# Patient Record
Sex: Female | Born: 1940 | Race: White | Hispanic: No | Marital: Married | Smoking: Never smoker
Health system: Southern US, Community
[De-identification: ages and names within clinical notes are randomized; demographics above are authoritative.]

## PROBLEM LIST (undated history)

## (undated) DIAGNOSIS — E079 Disorder of thyroid, unspecified: Secondary | ICD-10-CM

## (undated) DIAGNOSIS — M810 Age-related osteoporosis without current pathological fracture: Secondary | ICD-10-CM

## (undated) DIAGNOSIS — G309 Alzheimer's disease, unspecified: Secondary | ICD-10-CM

## (undated) DIAGNOSIS — F0280 Dementia in other diseases classified elsewhere without behavioral disturbance: Secondary | ICD-10-CM

## (undated) DIAGNOSIS — I10 Essential (primary) hypertension: Secondary | ICD-10-CM

## (undated) DIAGNOSIS — R2681 Unsteadiness on feet: Secondary | ICD-10-CM

## (undated) HISTORY — DX: Essential (primary) hypertension: I10

## (undated) HISTORY — DX: Alzheimer's disease, unspecified: G30.9

## (undated) HISTORY — DX: Age-related osteoporosis without current pathological fracture: M81.0

## (undated) HISTORY — PX: ABDOMINAL HYSTERECTOMY: SHX81

## (undated) HISTORY — DX: Dementia in other diseases classified elsewhere, unspecified severity, without behavioral disturbance, psychotic disturbance, mood disturbance, and anxiety: F02.80

## (undated) HISTORY — PX: FRACTURE SURGERY: SHX138

## (undated) HISTORY — PX: CHOLECYSTECTOMY: SHX55

## (undated) HISTORY — DX: Unsteadiness on feet: R26.81

## (undated) HISTORY — PX: COLONOSCOPY: SHX174

## (undated) HISTORY — DX: Disorder of thyroid, unspecified: E07.9

---

## 2005-11-30 ENCOUNTER — Encounter: Admission: RE | Admit: 2005-11-30 | Discharge: 2005-11-30 | Payer: Self-pay | Admitting: General Surgery

## 2015-04-26 DIAGNOSIS — R0601 Orthopnea: Secondary | ICD-10-CM | POA: Diagnosis not present

## 2015-04-26 DIAGNOSIS — Z789 Other specified health status: Secondary | ICD-10-CM | POA: Diagnosis not present

## 2015-04-26 DIAGNOSIS — I10 Essential (primary) hypertension: Secondary | ICD-10-CM | POA: Diagnosis not present

## 2015-04-26 DIAGNOSIS — R630 Anorexia: Secondary | ICD-10-CM | POA: Diagnosis not present

## 2015-04-26 DIAGNOSIS — Z418 Encounter for other procedures for purposes other than remedying health state: Secondary | ICD-10-CM | POA: Diagnosis not present

## 2015-04-26 DIAGNOSIS — R5383 Other fatigue: Secondary | ICD-10-CM | POA: Diagnosis not present

## 2015-04-26 DIAGNOSIS — Z6824 Body mass index (BMI) 24.0-24.9, adult: Secondary | ICD-10-CM | POA: Diagnosis not present

## 2015-05-13 DIAGNOSIS — R0601 Orthopnea: Secondary | ICD-10-CM | POA: Diagnosis not present

## 2015-05-13 DIAGNOSIS — R069 Unspecified abnormalities of breathing: Secondary | ICD-10-CM | POA: Diagnosis not present

## 2015-05-13 DIAGNOSIS — Z23 Encounter for immunization: Secondary | ICD-10-CM | POA: Diagnosis not present

## 2015-05-24 DIAGNOSIS — R630 Anorexia: Secondary | ICD-10-CM | POA: Diagnosis not present

## 2015-07-03 DIAGNOSIS — N183 Chronic kidney disease, stage 3 (moderate): Secondary | ICD-10-CM | POA: Diagnosis not present

## 2015-07-05 DIAGNOSIS — G309 Alzheimer's disease, unspecified: Secondary | ICD-10-CM | POA: Diagnosis not present

## 2015-07-05 DIAGNOSIS — M810 Age-related osteoporosis without current pathological fracture: Secondary | ICD-10-CM | POA: Diagnosis not present

## 2015-07-05 DIAGNOSIS — M25561 Pain in right knee: Secondary | ICD-10-CM | POA: Diagnosis not present

## 2015-07-05 DIAGNOSIS — R2681 Unsteadiness on feet: Secondary | ICD-10-CM | POA: Diagnosis not present

## 2015-08-06 DIAGNOSIS — E2839 Other primary ovarian failure: Secondary | ICD-10-CM | POA: Diagnosis not present

## 2015-08-16 ENCOUNTER — Encounter (HOSPITAL_COMMUNITY): Payer: Self-pay | Admitting: Emergency Medicine

## 2015-08-16 ENCOUNTER — Emergency Department (HOSPITAL_COMMUNITY): Payer: Medicare Other

## 2015-08-16 ENCOUNTER — Observation Stay (HOSPITAL_COMMUNITY)
Admission: EM | Admit: 2015-08-16 | Discharge: 2015-08-18 | Disposition: A | Discharge status: Home / Self Care | Payer: Medicare Other | Attending: Internal Medicine | Admitting: Internal Medicine

## 2015-08-16 DIAGNOSIS — I951 Orthostatic hypotension: Principal | ICD-10-CM

## 2015-08-16 DIAGNOSIS — Z79899 Other long term (current) drug therapy: Secondary | ICD-10-CM | POA: Insufficient documentation

## 2015-08-16 DIAGNOSIS — E039 Hypothyroidism, unspecified: Secondary | ICD-10-CM | POA: Diagnosis present

## 2015-08-16 DIAGNOSIS — R55 Syncope and collapse: Secondary | ICD-10-CM | POA: Diagnosis present

## 2015-08-16 DIAGNOSIS — R296 Repeated falls: Secondary | ICD-10-CM

## 2015-08-16 DIAGNOSIS — Z7982 Long term (current) use of aspirin: Secondary | ICD-10-CM | POA: Insufficient documentation

## 2015-08-16 DIAGNOSIS — R52 Pain, unspecified: Secondary | ICD-10-CM

## 2015-08-16 DIAGNOSIS — T1490 Injury, unspecified: Secondary | ICD-10-CM | POA: Diagnosis not present

## 2015-08-16 DIAGNOSIS — I10 Essential (primary) hypertension: Secondary | ICD-10-CM | POA: Diagnosis present

## 2015-08-16 DIAGNOSIS — T148 Other injury of unspecified body region: Secondary | ICD-10-CM | POA: Diagnosis not present

## 2015-08-16 DIAGNOSIS — M25552 Pain in left hip: Secondary | ICD-10-CM | POA: Diagnosis not present

## 2015-08-16 LAB — COMPREHENSIVE METABOLIC PANEL
ALK PHOS: 55 U/L (ref 38–126)
ALT: 18 U/L (ref 14–54)
ANION GAP: 7 (ref 5–15)
AST: 24 U/L (ref 15–41)
Albumin: 3.5 g/dL (ref 3.5–5.0)
BUN: 25 mg/dL — ABNORMAL HIGH (ref 6–20)
CO2: 24 mmol/L (ref 22–32)
Calcium: 8.2 mg/dL — ABNORMAL LOW (ref 8.9–10.3)
Chloride: 102 mmol/L (ref 101–111)
Creatinine, Ser: 1.37 mg/dL — ABNORMAL HIGH (ref 0.44–1.00)
GFR, EST AFRICAN AMERICAN: 43 mL/min — AB (ref >60)
GFR, EST NON AFRICAN AMERICAN: 37 mL/min — AB (ref >60)
Glucose, Bld: 83 mg/dL (ref 65–99)
POTASSIUM: 3.8 mmol/L (ref 3.5–5.1)
SODIUM: 133 mmol/L — AB (ref 135–145)
TOTAL PROTEIN: 6 g/dL — AB (ref 6.5–8.1)
Total Bilirubin: 1.1 mg/dL (ref 0.3–1.2)

## 2015-08-16 LAB — CBC WITH DIFFERENTIAL/PLATELET
BASOS PCT: 0 %
Basophils Absolute: 0 10*3/uL (ref 0.0–0.1)
EOS ABS: 0.1 10*3/uL (ref 0.0–0.7)
Eosinophils Relative: 1 %
HCT: 32.8 % — ABNORMAL LOW (ref 36.0–46.0)
Hemoglobin: 11 g/dL — ABNORMAL LOW (ref 12.0–15.0)
Lymphocytes Relative: 35 %
Lymphs Abs: 3.9 10*3/uL (ref 0.7–4.0)
MCH: 32.9 pg (ref 26.0–34.0)
MCHC: 33.5 g/dL (ref 30.0–36.0)
MCV: 98.2 fL (ref 78.0–100.0)
MONO ABS: 1 10*3/uL (ref 0.1–1.0)
MONOS PCT: 9 %
Neutro Abs: 6 10*3/uL (ref 1.7–7.7)
Neutrophils Relative %: 55 %
Platelets: 320 10*3/uL (ref 150–400)
RBC: 3.34 MIL/uL — ABNORMAL LOW (ref 3.87–5.11)
RDW: 15.1 % (ref 11.5–15.5)
WBC: 11 10*3/uL — ABNORMAL HIGH (ref 4.0–10.5)

## 2015-08-16 LAB — URINALYSIS, ROUTINE W REFLEX MICROSCOPIC
Bilirubin Urine: NEGATIVE
GLUCOSE, UA: NEGATIVE mg/dL
Hgb urine dipstick: NEGATIVE
Ketones, ur: NEGATIVE mg/dL
LEUKOCYTES UA: NEGATIVE
Nitrite: NEGATIVE
PROTEIN: NEGATIVE mg/dL
SPECIFIC GRAVITY, URINE: 1.01 (ref 1.005–1.030)
pH: 5.5 (ref 5.0–8.0)

## 2015-08-16 LAB — RAPID URINE DRUG SCREEN, HOSP PERFORMED
Amphetamines: NOT DETECTED
BARBITURATES: NOT DETECTED
Benzodiazepines: NOT DETECTED
COCAINE: NOT DETECTED
OPIATES: POSITIVE — AB
Tetrahydrocannabinol: NOT DETECTED

## 2015-08-16 LAB — ACETAMINOPHEN LEVEL

## 2015-08-16 LAB — TROPONIN I

## 2015-08-16 LAB — ETHANOL

## 2015-08-16 LAB — SALICYLATE LEVEL

## 2015-08-16 LAB — LACTIC ACID, PLASMA: Lactic Acid, Venous: 0.8 mmol/L (ref 0.5–2.0)

## 2015-08-16 MED ORDER — SODIUM CHLORIDE 0.9 % IV BOLUS (SEPSIS)
500.0000 mL | Freq: Once | INTRAVENOUS | Status: AC
  Administered 2015-08-16: 500 mL via INTRAVENOUS

## 2015-08-16 MED ORDER — SODIUM CHLORIDE 0.9 % IV SOLN
INTRAVENOUS | Status: DC
  Administered 2015-08-16: 23:00:00 via INTRAVENOUS

## 2015-08-16 NOTE — ED Provider Notes (Signed)
Labs shows what is likely mild AKI (no baseline). She is orthostatic with BP dropping to 90s and her feeling symptomatic. Given fluids. Given frequent falls and multiple bruises, will admit to obs. Dr. Hal Hope requests tele bed.  Sherwood Gambler, MD 08/16/15 [phone]

## 2015-08-16 NOTE — ED Notes (Signed)
Pt with fall x 2 today. No c/o pain. Pt sent to ED by family for eval. No LOC. Pt has had mult recent falls, reports she is not eating well right now but that is normal for her. PCP has pt on an appetite medication.

## 2015-08-16 NOTE — ED Notes (Signed)
Pt in radiology 

## 2015-08-16 NOTE — ED Provider Notes (Signed)
CSN: [id number]     Arrival date & time 08/16/15  1944 History   First MD Initiated Contact with Patient 08/16/15 1951     Chief Complaint  Patient presents with  . Fall      HPI Pt was seen at 2010. Per pt and her family, c/o gradual onset and persistence of multiple intermittent episodes of "falling" for the past 1 year, worse over the past few days. Pt told family her "left hip hurt" but she denies this now. Denies any specific complaint. Denies syncope/near syncope, no AMS, no back pain, no CP/palpitations, no SOB/cough, no abd pain, no N/V/D, no fevers, no focal motor weakness, no tingling/numbness in extremities, no facial droop, no slurred speech.    History reviewed. No pertinent past medical history.   Past Surgical History  Procedure Laterality Date  . Cholecystectomy    . Fracture surgery    . Abdominal hysterectomy     Family History  Problem Relation Age of Onset  . Cancer Mother   . Cancer Father   . Heart attack Sister    Social History  Substance Use Topics  . Smoking status: Never Smoker   . Smokeless tobacco: Never Used  . Alcohol Use: No   OB History    Gravida Para Term Preterm AB TAB SAB Ectopic Multiple Living   1 1 1             Review of Systems ROS: Statement: All systems negative except as marked or noted in the HPI; Constitutional: Negative for fever and chills. +frequent falls.; ; Eyes: Negative for eye pain, redness and discharge. ; ; ENMT: Negative for ear pain, hoarseness, nasal congestion, sinus pressure and sore throat. ; ; Cardiovascular: Negative for chest pain, palpitations, diaphoresis, dyspnea and peripheral edema. ; ; Respiratory: Negative for cough, wheezing and stridor. ; ; Gastrointestinal: Negative for nausea, vomiting, diarrhea, abdominal pain, blood in stool, hematemesis, jaundice and rectal bleeding. . ; ; Genitourinary: Negative for dysuria, flank pain and hematuria. ; ; Musculoskeletal: +left hip pain. Negative for back pain and  neck pain. Negative for swelling and deformity.; ; Skin: Negative for pruritus, rash, abrasions, blisters, bruising and skin lesion.; ; Neuro: Negative for headache, lightheadedness and neck stiffness. Negative for weakness, altered level of consciousness, altered mental status, extremity weakness, paresthesias, involuntary movement, seizure and syncope.      Allergies  Review of patient's allergies indicates no known allergies.  Home Medications   Prior to Admission medications   Not on File   BP 140/77 mmHg  Pulse 64  Temp(Src) 98.3 F (36.8 C) (Oral)  Resp 20  Ht 5\' 3"  (1.6 m)  Wt 125 lb (56.7 kg)  BMI 22.15 kg/m2  SpO2 100%   21:16 Orthostatic Vital Signs JW  Orthostatic Lying  - BP- Lying: 130/68 mmHg ; Pulse- Lying: 64  Orthostatic Sitting - BP- Sitting: 123/83 mmHg ; Pulse- Sitting: 67  Orthostatic Standing at 0 minutes - BP- Standing at 0 minutes: 93/35 mmHg (Patient feld weak while she was standing) ; Pulse- Standing at 0 minutes: 72      Physical Exam  2015: Physical examination: Vital signs and O2 SAT: Reviewed; Constitutional: Well developed, Well nourished, Well hydrated, In no acute distress; Head and Face: Normocephalic, Atraumatic; Eyes: EOMI, PERRL, No scleral icterus; ENMT: Mouth and pharynx normal, Mucous membranes moist; Neck: Supple, Trachea midline; Spine: No midline CS, TS, LS tenderness.; Cardiovascular: Regular rate and rhythm, No gallop; Respiratory: Breath sounds clear & equal bilaterally,  No wheezes, Normal respiratory effort/excursion; Chest: Nontender, No deformity, Movement normal, No crepitus, No abrasions or ecchymosis.; Abdomen: Soft, Nontender, Nondistended, Normal bowel sounds, No abrasions or ecchymosis.; Genitourinary: No CVA tenderness;; Extremities: No deformity, +mild left hip TTP, otherwise full range of motion major/large joints of bilat UE's and LE's without pain or tenderness to palp, Neurovascularly intact, Pulses normal, No tenderness, No  edema, Pelvis stable. +scattered ecchymosis in various stages of healing bilat LE's.; Neuro: AA&Ox3. Vague historian.  Major CN grossly intact.  No facial droop. Speech clear. No gross focal motor or sensory deficits in extremities.; Skin: Color normal, Warm, Dry.   ED Course  Procedures (including critical care time) Labs Review  Imaging Review  I have personally reviewed and evaluated these images and lab results as part of my medical decision-making.   EKG Interpretation None      MDM  MDM Reviewed: nursing note and vitals Interpretation: labs, ECG, x-ray and CT scan   ED ECG REPORT   Date: 08/16/2015  Rate: 61  Rhythm: normal sinus rhythm  QRS Axis: normal  Intervals: normal  ST/T Wave abnormalities: normal  Conduction Disutrbances:none  Narrative Interpretation:   Old EKG Reviewed: none available.  Results for orders placed or performed during the hospital encounter of 08/16/15  Comprehensive metabolic panel  Result Value Ref Range   Sodium 133 (L) 135 - 145 mmol/L   Potassium 3.8 3.5 - 5.1 mmol/L   Chloride 102 101 - 111 mmol/L   CO2 24 22 - 32 mmol/L   Glucose, Bld 83 65 - 99 mg/dL   BUN 25 (H) 6 - 20 mg/dL   Creatinine, Ser 1.37 (H) 0.44 - 1.00 mg/dL   Calcium 8.2 (L) 8.9 - 10.3 mg/dL   Total Protein 6.0 (L) 6.5 - 8.1 g/dL   Albumin 3.5 3.5 - 5.0 g/dL   AST 24 15 - 41 U/L   ALT 18 14 - 54 U/L   Alkaline Phosphatase 55 38 - 126 U/L   Total Bilirubin 1.1 0.3 - 1.2 mg/dL   GFR calc non Af Amer 37 (L) >60 mL/min   GFR calc Af Amer 43 (L) >60 mL/min   Anion gap 7 5 - 15  Ethanol  Result Value Ref Range   Alcohol, Ethyl (B) <5 <5 mg/dL  Acetaminophen level  Result Value Ref Range   Acetaminophen (Tylenol), Serum <10 (L) 10 - 30 ug/mL  Salicylate level  Result Value Ref Range   Salicylate Lvl 123456 2.8 - 30.0 mg/dL  Troponin I  Result Value Ref Range   Troponin I <0.03 <0.031 ng/mL  Lactic acid, plasma  Result Value Ref Range   Lactic Acid, Venous  0.8 0.5 - 2.0 mmol/L  CBC with Differential  Result Value Ref Range   WBC 11.0 (H) 4.0 - 10.5 K/uL   RBC 3.34 (L) 3.87 - 5.11 MIL/uL   Hemoglobin 11.0 (L) 12.0 - 15.0 g/dL   HCT 32.8 (L) 36.0 - 46.0 %   MCV 98.2 78.0 - 100.0 fL   MCH 32.9 26.0 - 34.0 pg   MCHC 33.5 30.0 - 36.0 g/dL   RDW 15.1 11.5 - 15.5 %   Platelets 320 150 - 400 K/uL   Neutrophils Relative % 55 %   Neutro Abs 6.0 1.7 - 7.7 K/uL   Lymphocytes Relative 35 %   Lymphs Abs 3.9 0.7 - 4.0 K/uL   Monocytes Relative 9 %   Monocytes Absolute 1.0 0.1 - 1.0 K/uL   Eosinophils Relative  1 %   Eosinophils Absolute 0.1 0.0 - 0.7 K/uL   Basophils Relative 0 %   Basophils Absolute 0.0 0.0 - 0.1 K/uL   Ct Head Wo Contrast 08/16/2015  CLINICAL DATA:  Leg weakness, multiple falls, including 2 falls today. No loss of consciousness. EXAM: CT HEAD WITHOUT CONTRAST CT CERVICAL SPINE WITHOUT CONTRAST TECHNIQUE: Multidetector CT imaging of the head and cervical spine was performed following the standard protocol without intravenous contrast. Multiplanar CT image reconstructions of the cervical spine were also generated. COMPARISON:  None. FINDINGS: CT HEAD FINDINGS INTRACRANIAL CONTENTS: The ventricles and sulci are upper limits of normal for age. No intraparenchymal hemorrhage, mass effect nor midline shift. Patchy supratentorial white matter hypodensities are within normal range for patient's age and though non-specific likely represent chronic small vessel ischemic disease. No acute large vascular territory infarcts. No abnormal extra-axial fluid collections. Basal cisterns are patent. Mild calcific atherosclerosis of the carotid siphons. ORBITS: The included ocular globes and orbital contents are non-suspicious. SINUSES: The mastoid aircells and included paranasal sinuses are well-aerated. SKULL/SOFT TISSUES: No skull fracture. No significant soft tissue swelling. CT CERVICAL SPINE FINDINGS OSSEOUS STRUCTURES: Cervical vertebral bodies intact.  Straightened cervical lordosis. Grade 1 C4-5 anterolisthesis with C5-6 auto interbody arthrodesis infused assessed on degenerative basis. Moderate C5-6 and C6-7 disc height loss. No destructive bony lesions. C1-2 articulation maintained with moderate arthropathy. Severe LEFT C4-5 facet arthropathy. No osseous canal stenosis. Severe LEFT C4-5 neural foraminal narrowing. SOFT TISSUES: Included prevertebral and paraspinal soft tissues are nonacute, mild calcific atherosclerosis of the carotid bulbs. IMPRESSION: CT HEAD: No acute intracranial process. Mild prominence of ventricles and sulci for age. Moderate chronic small vessel ischemic disease. CT CERVICAL SPINE: Straightened cervical lordosis without acute fracture. Grade 1 C4-5 anterolisthesis on degenerative basis. Electronically Signed   By: Elon Alas M.D.   On: 08/16/2015 22:23   Ct Cervical Spine Wo Contrast 08/16/2015  CLINICAL DATA:  Leg weakness, multiple falls, including 2 falls today. No loss of consciousness. EXAM: CT HEAD WITHOUT CONTRAST CT CERVICAL SPINE WITHOUT CONTRAST TECHNIQUE: Multidetector CT imaging of the head and cervical spine was performed following the standard protocol without intravenous contrast. Multiplanar CT image reconstructions of the cervical spine were also generated. COMPARISON:  None. FINDINGS: CT HEAD FINDINGS INTRACRANIAL CONTENTS: The ventricles and sulci are upper limits of normal for age. No intraparenchymal hemorrhage, mass effect nor midline shift. Patchy supratentorial white matter hypodensities are within normal range for patient's age and though non-specific likely represent chronic small vessel ischemic disease. No acute large vascular territory infarcts. No abnormal extra-axial fluid collections. Basal cisterns are patent. Mild calcific atherosclerosis of the carotid siphons. ORBITS: The included ocular globes and orbital contents are non-suspicious. SINUSES: The mastoid aircells and included paranasal  sinuses are well-aerated. SKULL/SOFT TISSUES: No skull fracture. No significant soft tissue swelling. CT CERVICAL SPINE FINDINGS OSSEOUS STRUCTURES: Cervical vertebral bodies intact. Straightened cervical lordosis. Grade 1 C4-5 anterolisthesis with C5-6 auto interbody arthrodesis infused assessed on degenerative basis. Moderate C5-6 and C6-7 disc height loss. No destructive bony lesions. C1-2 articulation maintained with moderate arthropathy. Severe LEFT C4-5 facet arthropathy. No osseous canal stenosis. Severe LEFT C4-5 neural foraminal narrowing. SOFT TISSUES: Included prevertebral and paraspinal soft tissues are nonacute, mild calcific atherosclerosis of the carotid bulbs. IMPRESSION: CT HEAD: No acute intracranial process. Mild prominence of ventricles and sulci for age. Moderate chronic small vessel ischemic disease. CT CERVICAL SPINE: Straightened cervical lordosis without acute fracture. Grade 1 C4-5 anterolisthesis on degenerative basis. Electronically  Signed   By: Elon Alas M.D.   On: 08/16/2015 22:23    Dg Chest 2 View 08/16/2015  CLINICAL DATA:  75 year old female with history of multiple falls today. EXAM: CHEST  2 VIEW COMPARISON:  Chest x-ray 02/15/2015. FINDINGS: Lung volumes are normal. No consolidative airspace disease. No pleural effusions. No pneumothorax. No pulmonary nodule or mass noted. Pulmonary vasculature and the cardiomediastinal silhouette are within normal limits. Old healed fracture of the anterior aspect of the right fourth rib again noted. IMPRESSION: No radiographic evidence of acute cardiopulmonary disease. Electronically Signed   By: Vinnie Langton M.D.   On: 08/16/2015 21:11   Dg Hip Unilat With Pelvis 2-3 Views Left 08/16/2015  CLINICAL DATA:  75 year old female with history of trauma from multiple falls today. Left hip pain. EXAM: DG HIP (WITH OR WITHOUT PELVIS) 2-3V LEFT COMPARISON:  No priors. FINDINGS: AP view of the pelvis and AP and lateral views of the left  hip demonstrate no acute displaced fractures of the bony pelvic ring. Bilateral proximal femurs as visualized appear intact, and the left femoral head is properly located. There is mild joint space narrowing, subchondral sclerosis, subchondral cyst formation and osteophyte formation in the hip joints bilaterally, compatible with mild osteoarthritis. IMPRESSION: 1. No acute radiographic abnormality of the bony pelvis or left hip. 2. Mild bilateral hip joint osteoarthritis. Electronically Signed   By: Vinnie Langton M.D.   On: 08/16/2015 21:12    2230:  +orthostatic on VS; judicious IVF given. Pt unable to walk d/t orthostasis on standing. BUN/Cr elevated, no old to compare. Udip pending. Will need admit if continues unable to walk. Sign out to Dr. Regenia Skeeter.    Francine Graven, DO 08/17/15 Valerie Roys

## 2015-08-17 ENCOUNTER — Encounter (HOSPITAL_COMMUNITY): Payer: Self-pay | Admitting: Internal Medicine

## 2015-08-17 ENCOUNTER — Observation Stay (HOSPITAL_COMMUNITY): Payer: Medicare Other

## 2015-08-17 ENCOUNTER — Observation Stay (HOSPITAL_BASED_OUTPATIENT_CLINIC_OR_DEPARTMENT_OTHER): Payer: Medicare Other

## 2015-08-17 DIAGNOSIS — R55 Syncope and collapse: Secondary | ICD-10-CM

## 2015-08-17 DIAGNOSIS — I951 Orthostatic hypotension: Secondary | ICD-10-CM | POA: Diagnosis not present

## 2015-08-17 DIAGNOSIS — E039 Hypothyroidism, unspecified: Secondary | ICD-10-CM | POA: Diagnosis present

## 2015-08-17 DIAGNOSIS — M25561 Pain in right knee: Secondary | ICD-10-CM | POA: Diagnosis not present

## 2015-08-17 DIAGNOSIS — M79652 Pain in left thigh: Secondary | ICD-10-CM | POA: Diagnosis not present

## 2015-08-17 DIAGNOSIS — I10 Essential (primary) hypertension: Secondary | ICD-10-CM | POA: Diagnosis not present

## 2015-08-17 DIAGNOSIS — M25562 Pain in left knee: Secondary | ICD-10-CM | POA: Diagnosis not present

## 2015-08-17 LAB — CBC WITH DIFFERENTIAL/PLATELET
BASOS ABS: 0 10*3/uL (ref 0.0–0.1)
BASOS PCT: 0 %
EOS ABS: 0.1 10*3/uL (ref 0.0–0.7)
EOS PCT: 1 %
HCT: 29.5 % — ABNORMAL LOW (ref 36.0–46.0)
Hemoglobin: 10 g/dL — ABNORMAL LOW (ref 12.0–15.0)
Lymphocytes Relative: 38 %
Lymphs Abs: 3.2 10*3/uL (ref 0.7–4.0)
MCH: 33.2 pg (ref 26.0–34.0)
MCHC: 33.9 g/dL (ref 30.0–36.0)
MCV: 98 fL (ref 78.0–100.0)
Monocytes Absolute: 0.8 10*3/uL (ref 0.1–1.0)
Monocytes Relative: 10 %
Neutro Abs: 4.3 10*3/uL (ref 1.7–7.7)
Neutrophils Relative %: 51 %
PLATELETS: 255 10*3/uL (ref 150–400)
RBC: 3.01 MIL/uL — AB (ref 3.87–5.11)
RDW: 14.9 % (ref 11.5–15.5)
WBC: 8.3 10*3/uL (ref 4.0–10.5)

## 2015-08-17 LAB — ECHOCARDIOGRAM COMPLETE
Height: 63 in
Weight: 2563.2 oz

## 2015-08-17 LAB — COMPREHENSIVE METABOLIC PANEL
ALBUMIN: 2.9 g/dL — AB (ref 3.5–5.0)
ALT: 15 U/L (ref 14–54)
AST: 21 U/L (ref 15–41)
Alkaline Phosphatase: 44 U/L (ref 38–126)
Anion gap: 4 — ABNORMAL LOW (ref 5–15)
BUN: 22 mg/dL — AB (ref 6–20)
CHLORIDE: 111 mmol/L (ref 101–111)
CO2: 20 mmol/L — AB (ref 22–32)
CREATININE: 1.23 mg/dL — AB (ref 0.44–1.00)
Calcium: 7.7 mg/dL — ABNORMAL LOW (ref 8.9–10.3)
GFR calc non Af Amer: 42 mL/min — ABNORMAL LOW (ref >60)
GFR, EST AFRICAN AMERICAN: 48 mL/min — AB (ref >60)
Glucose, Bld: 94 mg/dL (ref 65–99)
Potassium: 3.6 mmol/L (ref 3.5–5.1)
SODIUM: 135 mmol/L (ref 135–145)
Total Bilirubin: 1.2 mg/dL (ref 0.3–1.2)
Total Protein: 5.1 g/dL — ABNORMAL LOW (ref 6.5–8.1)

## 2015-08-17 LAB — LACTIC ACID, PLASMA: LACTIC ACID, VENOUS: 0.6 mmol/L (ref 0.5–2.0)

## 2015-08-17 LAB — CK: CK TOTAL: 253 U/L — AB (ref 38–234)

## 2015-08-17 LAB — TROPONIN I: Troponin I: <0.03 ng/mL (ref <0.031)

## 2015-08-17 MED ORDER — ACETAMINOPHEN 325 MG PO TABS: 650.0000 mg | ORAL_TABLET | Freq: Four times a day (QID) | ORAL | Status: DC | PRN

## 2015-08-17 MED ORDER — OXYCODONE-ACETAMINOPHEN 5-325 MG PO TABS: 1.0000 | ORAL_TABLET | Freq: Three times a day (TID) | ORAL | Status: DC | PRN

## 2015-08-17 MED ORDER — ONDANSETRON HCL 4 MG/2ML IJ SOLN: 4.0000 mg | Freq: Four times a day (QID) | INTRAMUSCULAR | Status: DC | PRN

## 2015-08-17 MED ORDER — SODIUM CHLORIDE 0.9 % IV SOLN
INTRAVENOUS | Status: DC
  Administered 2015-08-17: 04:00:00 via INTRAVENOUS

## 2015-08-17 MED ORDER — MEGESTROL ACETATE 40 MG/ML PO SUSP
600.0000 mg | Freq: Three times a day (TID) | ORAL | Status: DC
  Administered 2015-08-17 (×3): 600 mg via ORAL
  Filled 2015-08-17 (×7): qty 15

## 2015-08-17 MED ORDER — ZOLPIDEM TARTRATE 5 MG PO TABS: 5.0000 mg | ORAL_TABLET | Freq: Every evening | ORAL | Status: DC | PRN

## 2015-08-17 MED ORDER — ONDANSETRON HCL 4 MG PO TABS: 4.0000 mg | ORAL_TABLET | Freq: Four times a day (QID) | ORAL | Status: DC | PRN

## 2015-08-17 MED ORDER — ATENOLOL 25 MG PO TABS
50.0000 mg | ORAL_TABLET | Freq: Every day | ORAL | Status: DC
  Administered 2015-08-17 – 2015-08-18 (×2): 50 mg via ORAL
  Filled 2015-08-17 (×2): qty 2

## 2015-08-17 MED ORDER — ACETAMINOPHEN 650 MG RE SUPP: 650.0000 mg | Freq: Four times a day (QID) | RECTAL | Status: DC | PRN

## 2015-08-17 MED ORDER — ENSURE ENLIVE PO LIQD
237.0000 mL | Freq: Two times a day (BID) | ORAL | Status: DC
  Administered 2015-08-17 – 2015-08-18 (×3): 237 mL via ORAL

## 2015-08-17 MED ORDER — CITALOPRAM HYDROBROMIDE 20 MG PO TABS
10.0000 mg | ORAL_TABLET | Freq: Every day | ORAL | Status: DC
  Administered 2015-08-17: 10 mg via ORAL
  Filled 2015-08-17: qty 1

## 2015-08-17 MED ORDER — CLONAZEPAM 0.5 MG PO TABS
0.5000 mg | ORAL_TABLET | Freq: Two times a day (BID) | ORAL | Status: DC
  Administered 2015-08-17 – 2015-08-18 (×3): 0.5 mg via ORAL
  Filled 2015-08-17 (×3): qty 1

## 2015-08-17 MED ORDER — HEPARIN SODIUM (PORCINE) 5000 UNIT/ML IJ SOLN
5000.0000 [IU] | Freq: Three times a day (TID) | INTRAMUSCULAR | Status: DC
  Administered 2015-08-17 – 2015-08-18 (×4): 5000 [IU] via SUBCUTANEOUS
  Filled 2015-08-17 (×4): qty 1

## 2015-08-17 MED ORDER — DONEPEZIL HCL 5 MG PO TABS
5.0000 mg | ORAL_TABLET | Freq: Every morning | ORAL | Status: DC
  Administered 2015-08-17 – 2015-08-18 (×2): 5 mg via ORAL
  Filled 2015-08-17 (×2): qty 1

## 2015-08-17 MED ORDER — LEVOTHYROXINE SODIUM 50 MCG PO TABS
50.0000 ug | ORAL_TABLET | Freq: Every day | ORAL | Status: DC
  Administered 2015-08-17 – 2015-08-18 (×2): 50 ug via ORAL
  Filled 2015-08-17 (×2): qty 1

## 2015-08-17 MED ORDER — ATENOLOL 25 MG PO TABS: 50.0000 mg | ORAL_TABLET | Freq: Every day | ORAL | Status: DC

## 2015-08-17 NOTE — Progress Notes (Addendum)
PROGRESS NOTE        PATIENT DETAILS Name: Monica Herman Age: 75 y.o. Sex: female Date of Birth: Aug 04, 1940 Admit Date: 08/16/2015 Admitting Physician Rise Patience, MD [id number], Levada Dy, NP  Brief Narrative: Patient is a 75 y.o. female with PMHx of hypertension, hypothyroidism, mild dementia presented for evaluation of frequent falls. Found to be orthostatic, admitted for further evaluation and treatment.  Subjective: No complaints this morning-denies any chest pain or shortness of breath. Large left ecchymotic area and upper thigh appears superficial and stable.  Assessment/Plan: Principal Problem: Frequent falls with near syncope: Likely secondary to orthostatic hypotension, telemetry negative. CT head/CT spine, pelvic x-ray negative for fractures. Await echocardiogram. Ambulate with physical therapy and follow recommendations.  Active Problems: Orthostatic hypotension: Likely secondary to HCTZ which has now been discontinued. Hydrated overnight, orthostatic vital signs have resolved this morning. Place TED hose, cautiously continue with beta blockers and ambulate with physical therapy.   Ecchymosis left upper thigh: Appears mostly superficial-likely secondary to multiple falls. Hemoglobin stable.  History of present illness: Appears mild, likely secondary to HCTZ, improving.  History of hypertension: Avoid tight control given above. Cautiously continue with atenolol-would not rechallenge with HCTZ in the future.  Hypothyroidism: Continue levothyroxine  Dementia: Appears mild, continue Aricept.  Anxiety/depression: Appears stable with Klonopin and Celexa.  DVT Prophylaxis: Prophylactic Lovenox   Code Status: DNR-reconfirmed with patient  Family Communication: None at bedside  Disposition Plan: Remain inpatient-await PT eval-suspect either Home health vs SNF on discharge  Antimicrobial  agents: None  Procedures: Echo-pending  CONSULTS:  None  Time spent: 25 minutes-Greater than 50% of this time was spent in counseling, explanation of diagnosis, planning of further management, and coordination of care.  MEDICATIONS: Anti-infectives    None      Scheduled Meds: . atenolol  50 mg Oral Daily  . citalopram  10 mg Oral QHS  . clonazePAM  0.5 mg Oral BID  . donepezil  5 mg Oral q morning - 10a  . feeding supplement (ENSURE ENLIVE)  237 mL Oral BID BM  . heparin  5,000 Units Subcutaneous Q8H  . levothyroxine  50 mcg Oral QAC breakfast  . megestrol  600 mg Oral TID   Continuous Infusions: . sodium chloride 75 mL/hr at 08/17/15 0422   PRN Meds:.acetaminophen **OR** acetaminophen, ondansetron **OR** ondansetron (ZOFRAN) IV, oxyCODONE-acetaminophen, zolpidem   PHYSICAL EXAM: Vital signs: Filed Vitals:   08/16/15 2300 08/16/15 2330 08/17/15 0051 08/17/15 0555  BP: 119/63 137/66 144/65 131/73  Pulse: 62 66 66 70  Temp:   98.1 F (36.7 C) 98.7 F (37.1 C)  TempSrc:   Oral Oral  Resp: 18 18 18 20   Height:   5\' 3"  (1.6 m)   Weight:   72.666 kg (160 lb 3.2 oz)   SpO2: 100% 95% 100% 99%   Filed Weights   08/16/15 1944 08/17/15 0051  Weight: 56.7 kg (125 lb) 72.666 kg (160 lb 3.2 oz)   Body mass index is 28.39 kg/(m^2).   Gen Exam: Awake and alert with clear speech. Not in any distress Neck: Supple, No JVD.   Chest: B/L Clear.   CVS: S1 S2 Regular, no murmurs. Abdomen: soft, BS +, non tender, non distended.  Extremities: no edema, lower extremities warm to touch.Large ecchymotic area in the left upper thigh Neurologic: Non Focal.   Skin: No  Rash or lesions   Wounds: N/A.    LABORATORY DATA: CBC:  Recent Labs Lab 08/16/15 2120 08/17/15 0233  WBC 11.0* 8.3  NEUTROABS 6.0 4.3  HGB 11.0* 10.0*  HCT 32.8* 29.5*  MCV 98.2 98.0  PLT 320 [id number]    Basic Metabolic Panel:  Recent Labs Lab 08/16/15 2120 08/17/15 0233  NA 133* 135  K 3.8 3.6  CL  102 111  CO2 24 20*  GLUCOSE 83 94  BUN 25* 22*  CREATININE 1.37* 1.23*  CALCIUM 8.2* 7.7*    GFR: Estimated Creatinine Clearance: 37.7 mL/min (by C-G formula based on Cr of 1.23).  Liver Function Tests:  Recent Labs Lab 08/16/15 2120 08/17/15 0233  AST 24 21  ALT 18 15  ALKPHOS 55 44  BILITOT 1.1 1.2  PROT 6.0* 5.1*  ALBUMIN 3.5 2.9*   No results for input(s): LIPASE, AMYLASE in the last 168 hours. No results for input(s): AMMONIA in the last 168 hours.  Coagulation Profile: No results for input(s): INR, PROTIME in the last 168 hours.  Cardiac Enzymes:  Recent Labs Lab 08/16/15 2120 08/17/15 0233  CKTOTAL  --  253*  TROPONINI <0.03 <0.03    BNP (last 3 results) No results for input(s): PROBNP in the last 8760 hours.  HbA1C: No results for input(s): HGBA1C in the last 72 hours.  CBG: No results for input(s): GLUCAP in the last 168 hours.  Lipid Profile: No results for input(s): CHOL, HDL, LDLCALC, TRIG, CHOLHDL, LDLDIRECT in the last 72 hours.  Thyroid Function Tests: No results for input(s): TSH, T4TOTAL, FREET4, T3FREE, THYROIDAB in the last 72 hours.  Anemia Panel: No results for input(s): VITAMINB12, FOLATE, FERRITIN, TIBC, IRON, RETICCTPCT in the last 72 hours.  Urine analysis:    Component Value Date/Time   COLORURINE YELLOW 08/16/2015 2202   APPEARANCEUR CLEAR 08/16/2015 2202   LABSPEC 1.010 08/16/2015 2202   PHURINE 5.5 08/16/2015 2202   GLUCOSEU NEGATIVE 08/16/2015 2202   HGBUR NEGATIVE 08/16/2015 2202   BILIRUBINUR NEGATIVE 08/16/2015 2202   KETONESUR NEGATIVE 08/16/2015 2202   PROTEINUR NEGATIVE 08/16/2015 2202   NITRITE NEGATIVE 08/16/2015 2202   LEUKOCYTESUR NEGATIVE 08/16/2015 2202    Sepsis Labs: Lactic Acid, Venous    Component Value Date/Time   LATICACIDVEN 0.6 08/16/2015 2324    MICROBIOLOGY: No results found for this or any previous visit (from the past 240 hour(s)).  RADIOLOGY STUDIES/RESULTS: Dg Chest 2  View  08/16/2015  CLINICAL DATA:  75 year old female with history of multiple falls today. EXAM: CHEST  2 VIEW COMPARISON:  Chest x-ray 02/15/2015. FINDINGS: Lung volumes are normal. No consolidative airspace disease. No pleural effusions. No pneumothorax. No pulmonary nodule or mass noted. Pulmonary vasculature and the cardiomediastinal silhouette are within normal limits. Old healed fracture of the anterior aspect of the right fourth rib again noted. IMPRESSION: No radiographic evidence of acute cardiopulmonary disease. Electronically Signed   By: Vinnie Langton M.D.   On: 08/16/2015 21:11   Ct Head Wo Contrast  08/16/2015  CLINICAL DATA:  Leg weakness, multiple falls, including 2 falls today. No loss of consciousness. EXAM: CT HEAD WITHOUT CONTRAST CT CERVICAL SPINE WITHOUT CONTRAST TECHNIQUE: Multidetector CT imaging of the head and cervical spine was performed following the standard protocol without intravenous contrast. Multiplanar CT image reconstructions of the cervical spine were also generated. COMPARISON:  None. FINDINGS: CT HEAD FINDINGS INTRACRANIAL CONTENTS: The ventricles and sulci are upper limits of normal for age. No intraparenchymal hemorrhage, mass effect nor midline  shift. Patchy supratentorial white matter hypodensities are within normal range for patient's age and though non-specific likely represent chronic small vessel ischemic disease. No acute large vascular territory infarcts. No abnormal extra-axial fluid collections. Basal cisterns are patent. Mild calcific atherosclerosis of the carotid siphons. ORBITS: The included ocular globes and orbital contents are non-suspicious. SINUSES: The mastoid aircells and included paranasal sinuses are well-aerated. SKULL/SOFT TISSUES: No skull fracture. No significant soft tissue swelling. CT CERVICAL SPINE FINDINGS OSSEOUS STRUCTURES: Cervical vertebral bodies intact. Straightened cervical lordosis. Grade 1 C4-5 anterolisthesis with C5-6 auto  interbody arthrodesis infused assessed on degenerative basis. Moderate C5-6 and C6-7 disc height loss. No destructive bony lesions. C1-2 articulation maintained with moderate arthropathy. Severe LEFT C4-5 facet arthropathy. No osseous canal stenosis. Severe LEFT C4-5 neural foraminal narrowing. SOFT TISSUES: Included prevertebral and paraspinal soft tissues are nonacute, mild calcific atherosclerosis of the carotid bulbs. IMPRESSION: CT HEAD: No acute intracranial process. Mild prominence of ventricles and sulci for age. Moderate chronic small vessel ischemic disease. CT CERVICAL SPINE: Straightened cervical lordosis without acute fracture. Grade 1 C4-5 anterolisthesis on degenerative basis. Electronically Signed   By: Elon Alas M.D.   On: 08/16/2015 22:23   Ct Cervical Spine Wo Contrast  08/16/2015  CLINICAL DATA:  Leg weakness, multiple falls, including 2 falls today. No loss of consciousness. EXAM: CT HEAD WITHOUT CONTRAST CT CERVICAL SPINE WITHOUT CONTRAST TECHNIQUE: Multidetector CT imaging of the head and cervical spine was performed following the standard protocol without intravenous contrast. Multiplanar CT image reconstructions of the cervical spine were also generated. COMPARISON:  None. FINDINGS: CT HEAD FINDINGS INTRACRANIAL CONTENTS: The ventricles and sulci are upper limits of normal for age. No intraparenchymal hemorrhage, mass effect nor midline shift. Patchy supratentorial white matter hypodensities are within normal range for patient's age and though non-specific likely represent chronic small vessel ischemic disease. No acute large vascular territory infarcts. No abnormal extra-axial fluid collections. Basal cisterns are patent. Mild calcific atherosclerosis of the carotid siphons. ORBITS: The included ocular globes and orbital contents are non-suspicious. SINUSES: The mastoid aircells and included paranasal sinuses are well-aerated. SKULL/SOFT TISSUES: No skull fracture. No significant  soft tissue swelling. CT CERVICAL SPINE FINDINGS OSSEOUS STRUCTURES: Cervical vertebral bodies intact. Straightened cervical lordosis. Grade 1 C4-5 anterolisthesis with C5-6 auto interbody arthrodesis infused assessed on degenerative basis. Moderate C5-6 and C6-7 disc height loss. No destructive bony lesions. C1-2 articulation maintained with moderate arthropathy. Severe LEFT C4-5 facet arthropathy. No osseous canal stenosis. Severe LEFT C4-5 neural foraminal narrowing. SOFT TISSUES: Included prevertebral and paraspinal soft tissues are nonacute, mild calcific atherosclerosis of the carotid bulbs. IMPRESSION: CT HEAD: No acute intracranial process. Mild prominence of ventricles and sulci for age. Moderate chronic small vessel ischemic disease. CT CERVICAL SPINE: Straightened cervical lordosis without acute fracture. Grade 1 C4-5 anterolisthesis on degenerative basis. Electronically Signed   By: Elon Alas M.D.   On: 08/16/2015 22:23   Dg Hip Unilat With Pelvis 2-3 Views Left  08/16/2015  CLINICAL DATA:  75 year old female with history of trauma from multiple falls today. Left hip pain. EXAM: DG HIP (WITH OR WITHOUT PELVIS) 2-3V LEFT COMPARISON:  No priors. FINDINGS: AP view of the pelvis and AP and lateral views of the left hip demonstrate no acute displaced fractures of the bony pelvic ring. Bilateral proximal femurs as visualized appear intact, and the left femoral head is properly located. There is mild joint space narrowing, subchondral sclerosis, subchondral cyst formation and osteophyte formation in the hip joints bilaterally, compatible  with mild osteoarthritis. IMPRESSION: 1. No acute radiographic abnormality of the bony pelvis or left hip. 2. Mild bilateral hip joint osteoarthritis. Electronically Signed   By: Vinnie Langton M.D.   On: 08/16/2015 21:12       Oren Binet, MD  [facility] Pager:[phone]  If 7PM-7AM, please contact night-coverage [email or web address] Password  [facility] 08/17/2015, 10:37 AM

## 2015-08-17 NOTE — Care Management Obs Status (Signed)
[facility] NOTIFICATION   Patient Details  Name: VANILLA COOMBS MRN: [id number] Date of Birth: Aug 29, 1940   Medicare Observation Status Notification Given:  Yes    Briant Sites, RN 08/17/2015, 4:02 PM

## 2015-08-17 NOTE — ED Notes (Signed)
Report given to Crystal on Dept 300, all questions answered.

## 2015-08-17 NOTE — H&P (Addendum)
History and Physical    Monica Herman [id number] DOB: 12-06-1940 DOA: 08/16/2015  PCP: Arsenio Katz, NP  Patient coming from: Home.  Chief Complaint: Falls.  HPI: Monica Herman is a 75 y.o. female with medical history significant of hypertension, hypothyroidism and memory problems who was recently started on Aricept last month was brought to the ER after patient was having frequent falls. Patient's last was yesterday when patient was trying to get out of the bed. Denies any dizziness chest pain shortness of breath palpitations. Patient states when she fell she hit the back of the head. Did not lose consciousness or any episode. 2 days prior to this patient had a fall. In the ER CT head C-spine x-rays of the hip were unremarkable. EKG shows a normal sinus rhythm with QTC of 383 ms. Patient was orthostatic with blood pressure dropping from [id number] systolic to 90 systolic on standing. Denies any nausea vomiting or diarrhea. On exam patient has a large ecchymotic area on the left thigh. Patient also complains of pain on the knee since patient was crawling on the knees after a fall.   ED Course: As per history of present illness.  Review of Systems: As per HPI otherwise 10 point review of systems negative.    History reviewed. No pertinent past medical history.  Past Surgical History  Procedure Laterality Date  . Cholecystectomy    . Fracture surgery    . Abdominal hysterectomy       reports that she has never smoked. She has never used smokeless tobacco. She reports that she does not drink alcohol or use illicit drugs.  No Known Allergies  Family History  Problem Relation Age of Onset  . Cancer Mother   . Cancer Father   . Heart attack Sister     Prior to Admission medications   Medication Sig Start Date End Date Taking? Authorizing Provider  alendronate (FOSAMAX) 70 MG tablet Take 1 tablet by mouth every Wednesday. 07/26/15  Yes Historical Provider, MD  aspirin EC 81 MG tablet  Take 81 mg by mouth every morning.   Yes Historical Provider, MD  atenolol-chlorthalidone (TENORETIC) 50-25 MG tablet Take 1 tablet by mouth every morning. 07/26/15  Yes Historical Provider, MD  citalopram (CELEXA) 10 MG tablet Take 10 mg by mouth at bedtime. 07/26/15  Yes Historical Provider, MD  clonazePAM (KLONOPIN) 0.5 MG tablet Take 1 tablet by mouth 2 (two) times daily. 07/26/15  Yes Historical Provider, MD  donepezil (ARICEPT) 5 MG tablet Take 5 mg by mouth every morning. 07/29/15  Yes Historical Provider, MD  levothyroxine (SYNTHROID, LEVOTHROID) 50 MCG tablet Take 50 mcg by mouth every morning. 07/26/15  Yes Historical Provider, MD  megestrol (MEGACE) 40 MG/ML suspension Take 15 mLs by mouth 3 (three) times daily. 07/25/15  Yes Historical Provider, MD  oxyCODONE-acetaminophen (PERCOCET/ROXICET) 5-325 MG tablet Take 1 tablet by mouth 3 (three) times daily as needed. For pain 07/26/15  Yes Historical Provider, MD  potassium chloride (K-DUR) 10 MEQ tablet Take 10 mEq by mouth every morning. 07/26/15  Yes Historical Provider, MD    Physical Exam: Filed Vitals:   08/16/15 2230 08/16/15 2300 08/16/15 2330 08/17/15 0051  BP: 122/62 119/63 137/66 144/65  Pulse: 61 62 66 66  Temp:    98.1 F (36.7 C)  TempSrc:    Oral  Resp: 16 18 18 18   Height:    5\' 3"  (1.6 m)  Weight:    160 lb 3.2 oz (72.666 kg)  SpO2: 100% 100% 95% 100%      Constitutional: Not in distress. Filed Vitals:   08/16/15 2230 08/16/15 2300 08/16/15 2330 08/17/15 0051  BP: 122/62 119/63 137/66 144/65  Pulse: 61 62 66 66  Temp:    98.1 F (36.7 C)  TempSrc:    Oral  Resp: 16 18 18 18   Height:    5\' 3"  (1.6 m)  Weight:    160 lb 3.2 oz (72.666 kg)  SpO2: 100% 100% 95% 100%   Eyes: Anicteric no pallor. ENMT: No discharge from the ears eyes nose or mouth. Neck: No mass felt. No neck rigidity. Respiratory: No rhonchi or crepitations. Cardiovascular: S1 and S2 heard. Abdomen: Soft nontender bowel sounds  present. Musculoskeletal: Patient has pain on flexing both knees. No obvious effusion. Skin: Large ecchymotic area on the left thigh area. Neurologic: Alert awake oriented to time place and person moves all extremities. Psychiatric: Appears normal.   Labs on Admission: I have personally reviewed following labs and imaging studies  CBC:  Recent Labs Lab 08/16/15 2120  WBC 11.0*  NEUTROABS 6.0  HGB 11.0*  HCT 32.8*  MCV 98.2  PLT 99991111   Basic Metabolic Panel:  Recent Labs Lab 08/16/15 2120  NA 133*  K 3.8  CL 102  CO2 24  GLUCOSE 83  BUN 25*  CREATININE 1.37*  CALCIUM 8.2*   GFR: Estimated Creatinine Clearance: 33.9 mL/min (by C-G formula based on Cr of 1.37). Liver Function Tests:  Recent Labs Lab 08/16/15 2120  AST 24  ALT 18  ALKPHOS 55  BILITOT 1.1  PROT 6.0*  ALBUMIN 3.5   No results for input(s): LIPASE, AMYLASE in the last 168 hours. No results for input(s): AMMONIA in the last 168 hours. Coagulation Profile: No results for input(s): INR, PROTIME in the last 168 hours. Cardiac Enzymes:  Recent Labs Lab 08/16/15 2120  TROPONINI <0.03   BNP (last 3 results) No results for input(s): PROBNP in the last 8760 hours. HbA1C: No results for input(s): HGBA1C in the last 72 hours. CBG: No results for input(s): GLUCAP in the last 168 hours. Lipid Profile: No results for input(s): CHOL, HDL, LDLCALC, TRIG, CHOLHDL, LDLDIRECT in the last 72 hours. Thyroid Function Tests: No results for input(s): TSH, T4TOTAL, FREET4, T3FREE, THYROIDAB in the last 72 hours. Anemia Panel: No results for input(s): VITAMINB12, FOLATE, FERRITIN, TIBC, IRON, RETICCTPCT in the last 72 hours. Urine analysis:    Component Value Date/Time   COLORURINE YELLOW 08/16/2015 2202   APPEARANCEUR CLEAR 08/16/2015 2202   LABSPEC 1.010 08/16/2015 2202   PHURINE 5.5 08/16/2015 2202   GLUCOSEU NEGATIVE 08/16/2015 2202   HGBUR NEGATIVE 08/16/2015 2202   BILIRUBINUR NEGATIVE 08/16/2015  2202   KETONESUR NEGATIVE 08/16/2015 2202   PROTEINUR NEGATIVE 08/16/2015 2202   NITRITE NEGATIVE 08/16/2015 2202   LEUKOCYTESUR NEGATIVE 08/16/2015 2202   Sepsis Labs: @LABRCNTIP (procalcitonin:4,lacticidven:4) )No results found for this or any previous visit (from the past 240 hour(s)).   Radiological Exams on Admission: Dg Chest 2 View  08/16/2015  CLINICAL DATA:  75 year old female with history of multiple falls today. EXAM: CHEST  2 VIEW COMPARISON:  Chest x-ray 02/15/2015. FINDINGS: Lung volumes are normal. No consolidative airspace disease. No pleural effusions. No pneumothorax. No pulmonary nodule or mass noted. Pulmonary vasculature and the cardiomediastinal silhouette are within normal limits. Old healed fracture of the anterior aspect of the right fourth rib again noted. IMPRESSION: No radiographic evidence of acute cardiopulmonary disease. Electronically Signed   By: Quillian Quince  Entrikin M.D.   On: 08/16/2015 21:11   Ct Head Wo Contrast  08/16/2015  CLINICAL DATA:  Leg weakness, multiple falls, including 2 falls today. No loss of consciousness. EXAM: CT HEAD WITHOUT CONTRAST CT CERVICAL SPINE WITHOUT CONTRAST TECHNIQUE: Multidetector CT imaging of the head and cervical spine was performed following the standard protocol without intravenous contrast. Multiplanar CT image reconstructions of the cervical spine were also generated. COMPARISON:  None. FINDINGS: CT HEAD FINDINGS INTRACRANIAL CONTENTS: The ventricles and sulci are upper limits of normal for age. No intraparenchymal hemorrhage, mass effect nor midline shift. Patchy supratentorial white matter hypodensities are within normal range for patient's age and though non-specific likely represent chronic small vessel ischemic disease. No acute large vascular territory infarcts. No abnormal extra-axial fluid collections. Basal cisterns are patent. Mild calcific atherosclerosis of the carotid siphons. ORBITS: The included ocular globes and  orbital contents are non-suspicious. SINUSES: The mastoid aircells and included paranasal sinuses are well-aerated. SKULL/SOFT TISSUES: No skull fracture. No significant soft tissue swelling. CT CERVICAL SPINE FINDINGS OSSEOUS STRUCTURES: Cervical vertebral bodies intact. Straightened cervical lordosis. Grade 1 C4-5 anterolisthesis with C5-6 auto interbody arthrodesis infused assessed on degenerative basis. Moderate C5-6 and C6-7 disc height loss. No destructive bony lesions. C1-2 articulation maintained with moderate arthropathy. Severe LEFT C4-5 facet arthropathy. No osseous canal stenosis. Severe LEFT C4-5 neural foraminal narrowing. SOFT TISSUES: Included prevertebral and paraspinal soft tissues are nonacute, mild calcific atherosclerosis of the carotid bulbs. IMPRESSION: CT HEAD: No acute intracranial process. Mild prominence of ventricles and sulci for age. Moderate chronic small vessel ischemic disease. CT CERVICAL SPINE: Straightened cervical lordosis without acute fracture. Grade 1 C4-5 anterolisthesis on degenerative basis. Electronically Signed   By: Elon Alas M.D.   On: 08/16/2015 22:23   Ct Cervical Spine Wo Contrast  08/16/2015  CLINICAL DATA:  Leg weakness, multiple falls, including 2 falls today. No loss of consciousness. EXAM: CT HEAD WITHOUT CONTRAST CT CERVICAL SPINE WITHOUT CONTRAST TECHNIQUE: Multidetector CT imaging of the head and cervical spine was performed following the standard protocol without intravenous contrast. Multiplanar CT image reconstructions of the cervical spine were also generated. COMPARISON:  None. FINDINGS: CT HEAD FINDINGS INTRACRANIAL CONTENTS: The ventricles and sulci are upper limits of normal for age. No intraparenchymal hemorrhage, mass effect nor midline shift. Patchy supratentorial white matter hypodensities are within normal range for patient's age and though non-specific likely represent chronic small vessel ischemic disease. No acute large vascular  territory infarcts. No abnormal extra-axial fluid collections. Basal cisterns are patent. Mild calcific atherosclerosis of the carotid siphons. ORBITS: The included ocular globes and orbital contents are non-suspicious. SINUSES: The mastoid aircells and included paranasal sinuses are well-aerated. SKULL/SOFT TISSUES: No skull fracture. No significant soft tissue swelling. CT CERVICAL SPINE FINDINGS OSSEOUS STRUCTURES: Cervical vertebral bodies intact. Straightened cervical lordosis. Grade 1 C4-5 anterolisthesis with C5-6 auto interbody arthrodesis infused assessed on degenerative basis. Moderate C5-6 and C6-7 disc height loss. No destructive bony lesions. C1-2 articulation maintained with moderate arthropathy. Severe LEFT C4-5 facet arthropathy. No osseous canal stenosis. Severe LEFT C4-5 neural foraminal narrowing. SOFT TISSUES: Included prevertebral and paraspinal soft tissues are nonacute, mild calcific atherosclerosis of the carotid bulbs. IMPRESSION: CT HEAD: No acute intracranial process. Mild prominence of ventricles and sulci for age. Moderate chronic small vessel ischemic disease. CT CERVICAL SPINE: Straightened cervical lordosis without acute fracture. Grade 1 C4-5 anterolisthesis on degenerative basis. Electronically Signed   By: Elon Alas M.D.   On: 08/16/2015 22:23   Dg Hip  Unilat With Pelvis 2-3 Views Left  08/16/2015  CLINICAL DATA:  75 year old female with history of trauma from multiple falls today. Left hip pain. EXAM: DG HIP (WITH OR WITHOUT PELVIS) 2-3V LEFT COMPARISON:  No priors. FINDINGS: AP view of the pelvis and AP and lateral views of the left hip demonstrate no acute displaced fractures of the bony pelvic ring. Bilateral proximal femurs as visualized appear intact, and the left femoral head is properly located. There is mild joint space narrowing, subchondral sclerosis, subchondral cyst formation and osteophyte formation in the hip joints bilaterally, compatible with mild  osteoarthritis. IMPRESSION: 1. No acute radiographic abnormality of the bony pelvis or left hip. 2. Mild bilateral hip joint osteoarthritis. Electronically Signed   By: Vinnie Langton M.D.   On: 08/16/2015 21:12    EKG: Independently reviewed. Normal sinus rhythm with QTC of 383 ms.  Assessment/Plan Principal Problem:   Near syncope Active Problems:   Frequent falls   Orthostatic hypotension   Essential hypertension   Hypothyroidism    #1. Near syncope with frequent falls and patient being orthostatic likely cause - at this time we will hold off patient's hydrochlorothiazide and gently hydrate. Close monitor in telemetry for any significant bradycardia since patient is on atenolol and Aricept. Aricept was recently started. Physical therapy consulted. Check 2-D echo. CK levels. #2. Hypertension - continue at no longer hold hydrochlorothiazide since patient is orthostatic. #3. Anemia - baseline hemoglobin not known closely follow CBC. Patient has a large ecchymotic area on the left thigh. #4. Hypothyroidism on Synthroid. #5. Renal failure probably acute baseline creatinine not known gently hydrate and recheck metabolic panel. #6. Memory issues recently started on Aricept.   X-ray of the left femur and bilateral knee are pending.   DVT prophylaxis: Heparin. Code Status: DO NOT RESUSCITATE.  Family Communication: Patient's daughter and granddaughter.  Disposition Plan: Home versus SNF.  Consults called: Physical therapy and social work.  Admission status: Observation. Telemetry.    Rise Patience MD [facility] Pager [phone].  If 7PM-7AM, please contact night-coverage [email or web address] Password [email or web address]  08/17/2015, 1:44 AM

## 2015-08-17 NOTE — Progress Notes (Signed)
*  PRELIMINARY RESULTS* Echocardiogram 2D Echocardiogram has been performed.  Monica Herman 08/17/2015, 9:25 AM

## 2015-08-18 DIAGNOSIS — I951 Orthostatic hypotension: Secondary | ICD-10-CM | POA: Diagnosis not present

## 2015-08-18 DIAGNOSIS — R55 Syncope and collapse: Secondary | ICD-10-CM | POA: Diagnosis not present

## 2015-08-18 LAB — BASIC METABOLIC PANEL
ANION GAP: 4 — AB (ref 5–15)
BUN: 20 mg/dL (ref 6–20)
CO2: 23 mmol/L (ref 22–32)
Calcium: 8.5 mg/dL — ABNORMAL LOW (ref 8.9–10.3)
Chloride: 108 mmol/L (ref 101–111)
Creatinine, Ser: 1.35 mg/dL — ABNORMAL HIGH (ref 0.44–1.00)
GFR, EST AFRICAN AMERICAN: 43 mL/min — AB (ref >60)
GFR, EST NON AFRICAN AMERICAN: 37 mL/min — AB (ref >60)
GLUCOSE: 80 mg/dL (ref 65–99)
POTASSIUM: 3.9 mmol/L (ref 3.5–5.1)
SODIUM: 135 mmol/L (ref 135–145)

## 2015-08-18 MED ORDER — MEGESTROL ACETATE 40 MG/ML PO SUSP: 600.0000 mg | Freq: Two times a day (BID) | ORAL | Status: DC

## 2015-08-18 MED ORDER — ATENOLOL 50 MG PO TABS: 50.0000 mg | ORAL_TABLET | Freq: Every day | ORAL | Status: AC

## 2015-08-18 MED ORDER — MEGESTROL ACETATE 40 MG/ML PO SUSP
600.0000 mg | Freq: Two times a day (BID) | ORAL | Status: DC
  Administered 2015-08-18: 600 mg via ORAL
  Filled 2015-08-18 (×3): qty 15

## 2015-08-18 NOTE — Discharge Summary (Signed)
Monica Herman, is a 75 y.o. female  DOB 08/01/1940  MRN [id number].  Admission date:  08/16/2015  Admitting Physician  Rise Patience, MD  Discharge Date:  08/18/2015   Primary MD  Arsenio Katz, NP  Recommendations for primary care physician for things to follow:   Check orthostatics, check CBC, BMP, TSH in 3 days.   Admission Diagnosis  Orthostatic hypotension [I95.1] Frequent falls [R29.6]   Discharge Diagnosis  Orthostatic hypotension [I95.1] Frequent falls [R29.6]     Principal Problem:   Near syncope Active Problems:   Frequent falls   Orthostatic hypotension   Essential hypertension   Hypothyroidism      History reviewed. No pertinent past medical history.  Past Surgical History  Procedure Laterality Date  . Cholecystectomy    . Fracture surgery    . Abdominal hysterectomy         HPI  from the history and physical done on the day of admission:    Monica Herman is a 75 y.o. female with medical history significant of hypertension, hypothyroidism and memory problems who was recently started on Aricept last month was brought to the ER after patient was having frequent falls. Patient's last was yesterday when patient was trying to get out of the bed. Denies any dizziness chest pain shortness of breath palpitations. Patient states when she fell she hit the back of the head. Did not lose consciousness or any episode. 2 days prior to this patient had a fall. In the ER CT head C-spine x-rays of the hip were unremarkable. EKG shows a normal sinus rhythm with QTC of 383 ms. Patient was orthostatic with blood pressure dropping from [id number] systolic to 90 systolic on standing. Denies any nausea vomiting or diarrhea. On exam patient has a large ecchymotic area on the left thigh. Patient also complains of pain  on the knee since patient was crawling on the knees after a fall.      Hospital Course:     Frequent falls with near syncope: Secondary to orthostatic hypotension, telemetry negative. CT head/CT spine, pelvic x-ray negative for fractures. Stable echogram with EF of 60% without any wall motion abnormalities or critical stenosis see report for all details, was hydrated, orthostatic blood pressures stabilized, HCTZ discontinued. Will ambulate in the hallway. Will discharge with home health PT. Patient's symptom free this morning. Will have her follow with PCP in 3 days for repeat orthostatics and clinical evaluation.  Orthostatic hypotension: Due to HCTZ and mild dehydration resolved with IV fluids, HCTZ discontinued.   Ecchymosis left upper thigh: Appears mostly superficial-likely secondary to multiple falls. Hemoglobin stable. No tenderness, no problems with bearing, x-ray stable.  History of hypertension: Avoid tight control given above. Cautiously continue with atenolol-would not rechallenge with HCTZ in the future.  Hypothyroidism: Continue levothyroxine  Dementia: Appears mild, continue Aricept.  Anxiety/depression: Appears stable with Klonopin and Celexa.      Follow UP  Follow-up Information    Follow up with Arsenio Katz, NP. Schedule an appointment  as soon as possible for a visit in 3 days.   Specialty:  Nurse Practitioner   Contact information:   [place] [id number] [phone]        Consults obtained - none  Discharge Condition: Stable  Diet and Activity recommendation: See Discharge Instructions below  Discharge Instructions       Discharge Instructions    Diet - low sodium heart healthy    Complete by:  As directed      Discharge instructions    Complete by:  As directed   Follow with Primary MD Arsenio Katz, NP in 3 days   Get CBC, CMP, 2 view Chest X ray checked  by Primary MD next visit.    Activity: As tolerated with Full fall  precautions use walker/cane & assistance as needed   Disposition Home     Diet:   Heart Healthy  .  For Heart failure patients - Check your Weight same time everyday, if you gain over 2 pounds, or you develop in leg swelling, experience more shortness of breath or chest pain, call your Primary MD immediately. Follow Cardiac Low Salt Diet and 1.5 lit/day fluid restriction.   On your next visit with your primary care physician please Get Medicines reviewed and adjusted.   Please request your Prim.MD to go over all Hospital Tests and Procedure/Radiological results at the follow up, please get all Hospital records sent to your Prim MD by signing hospital release before you go home.   If you experience worsening of your admission symptoms, develop shortness of breath, life threatening emergency, suicidal or homicidal thoughts you must seek medical attention immediately by calling 911 or calling your MD immediately  if symptoms less severe.  You Must read complete instructions/literature along with all the possible adverse reactions/side effects for all the Medicines you take and that have been prescribed to you. Take any new Medicines after you have completely understood and accpet all the possible adverse reactions/side effects.   Do not drive, operating heavy machinery, perform activities at heights, swimming or participation in water activities or provide baby sitting services if your were admitted for syncope or siezures until you have seen by Primary MD or a Neurologist and advised to do so again.  Do not drive when taking Pain medications.    Do not take more than prescribed Pain, Sleep and Anxiety Medications  Special Instructions: If you have smoked or chewed Tobacco  in the last 2 yrs please stop smoking, stop any regular Alcohol  and or any Recreational drug use.  Wear Seat belts while driving.   Please note  You were cared for by a hospitalist during your hospital stay. If  you have any questions about your discharge medications or the care you received while you were in the hospital after you are discharged, you can call the unit and asked to speak with the hospitalist on call if the hospitalist that took care of you is not available. Once you are discharged, your primary care physician will handle any further medical issues. Please note that NO REFILLS for any discharge medications will be authorized once you are discharged, as it is imperative that you return to your primary care physician (or establish a relationship with a primary care physician if you do not have one) for your aftercare needs so that they can reassess your need for medications and monitor your lab values.     Increase activity slowly    Complete by:  As directed              Discharge Medications       Medication List    STOP taking these medications        atenolol-chlorthalidone 50-25 MG tablet  Commonly known as:  TENORETIC      TAKE these medications        alendronate 70 MG tablet  Commonly known as:  FOSAMAX  Take 1 tablet by mouth every Wednesday.     aspirin EC 81 MG tablet  Take 81 mg by mouth every morning.     atenolol 50 MG tablet  Commonly known as:  TENORMIN  Take 1 tablet (50 mg total) by mouth daily.     citalopram 10 MG tablet  Commonly known as:  CELEXA  Take 10 mg by mouth at bedtime.     clonazePAM 0.5 MG tablet  Commonly known as:  KLONOPIN  Take 1 tablet by mouth 2 (two) times daily.     donepezil 5 MG tablet  Commonly known as:  ARICEPT  Take 5 mg by mouth every morning.     levothyroxine 50 MCG tablet  Commonly known as:  SYNTHROID, LEVOTHROID  Take 50 mcg by mouth every morning.     megestrol 40 MG/ML suspension  Commonly known as:  MEGACE  Take 15 mLs by mouth 3 (three) times daily.     oxyCODONE-acetaminophen 5-325 MG tablet  Commonly known as:  PERCOCET/ROXICET  Take 1 tablet by mouth 3 (three) times daily as needed. For pain       potassium chloride 10 MEQ tablet  Commonly known as:  K-DUR  Take 10 mEq by mouth every morning.        Major procedures and Radiology Reports - PLEASE review detailed and final reports for all details, in brief -   TTE  Left ventricle: The cavity size was normal. Wall thickness was normal. Systolic function was normal. The estimated ejection  fraction was in the range of 55% to 60%. Wall motion was normal; there were no regional wall motion abnormalities. Left  ventricular diastolic function parameters were normal for the patient&'s age.  - Aortic valve: Valve area (Vmax): 1.89 cm^2.   Dg Chest 2 View  08/16/2015  CLINICAL DATA:  75 year old female with history of multiple falls today. EXAM: CHEST  2 VIEW COMPARISON:  Chest x-ray 02/15/2015. FINDINGS: Lung volumes are normal. No consolidative airspace disease. No pleural effusions. No pneumothorax. No pulmonary nodule or mass noted. Pulmonary vasculature and the cardiomediastinal silhouette are within normal limits. Old healed fracture of the anterior aspect of the right fourth rib again noted. IMPRESSION: No radiographic evidence of acute cardiopulmonary disease. Electronically Signed   By: Vinnie Langton M.D.   On: 08/16/2015 21:11   Dg Knee 1-2 Views Left  08/17/2015  CLINICAL DATA:  75 year old female with history of trauma from multiple falls recently. Left knee pain. EXAM: LEFT KNEE - 1-2 VIEW COMPARISON:  No priors. FINDINGS: There is no evidence of fracture, dislocation, or joint effusion. There is no evidence of arthropathy or other focal bone abnormality. Soft tissues are unremarkable. IMPRESSION: Negative. Electronically Signed   By: Vinnie Langton M.D.   On: 08/17/2015 15:00   Dg Knee 1-2 Views Right  08/17/2015  CLINICAL DATA:  75 year old female with history of trauma from multiple falls recently. Right knee pain. EXAM: RIGHT KNEE - 1-2 VIEW COMPARISON:  None. FINDINGS: There is no evidence of fracture, dislocation, or  joint  effusion. There is no evidence of arthropathy or other focal bone abnormality. Soft tissues are unremarkable. IMPRESSION: Negative. Electronically Signed   By: Vinnie Langton M.D.   On: 08/17/2015 15:01   Ct Head Wo Contrast  08/16/2015  CLINICAL DATA:  Leg weakness, multiple falls, including 2 falls today. No loss of consciousness. EXAM: CT HEAD WITHOUT CONTRAST CT CERVICAL SPINE WITHOUT CONTRAST TECHNIQUE: Multidetector CT imaging of the head and cervical spine was performed following the standard protocol without intravenous contrast. Multiplanar CT image reconstructions of the cervical spine were also generated. COMPARISON:  None. FINDINGS: CT HEAD FINDINGS INTRACRANIAL CONTENTS: The ventricles and sulci are upper limits of normal for age. No intraparenchymal hemorrhage, mass effect nor midline shift. Patchy supratentorial white matter hypodensities are within normal range for patient's age and though non-specific likely represent chronic small vessel ischemic disease. No acute large vascular territory infarcts. No abnormal extra-axial fluid collections. Basal cisterns are patent. Mild calcific atherosclerosis of the carotid siphons. ORBITS: The included ocular globes and orbital contents are non-suspicious. SINUSES: The mastoid aircells and included paranasal sinuses are well-aerated. SKULL/SOFT TISSUES: No skull fracture. No significant soft tissue swelling. CT CERVICAL SPINE FINDINGS OSSEOUS STRUCTURES: Cervical vertebral bodies intact. Straightened cervical lordosis. Grade 1 C4-5 anterolisthesis with C5-6 auto interbody arthrodesis infused assessed on degenerative basis. Moderate C5-6 and C6-7 disc height loss. No destructive bony lesions. C1-2 articulation maintained with moderate arthropathy. Severe LEFT C4-5 facet arthropathy. No osseous canal stenosis. Severe LEFT C4-5 neural foraminal narrowing. SOFT TISSUES: Included prevertebral and paraspinal soft tissues are nonacute, mild calcific  atherosclerosis of the carotid bulbs. IMPRESSION: CT HEAD: No acute intracranial process. Mild prominence of ventricles and sulci for age. Moderate chronic small vessel ischemic disease. CT CERVICAL SPINE: Straightened cervical lordosis without acute fracture. Grade 1 C4-5 anterolisthesis on degenerative basis. Electronically Signed   By: Elon Alas M.D.   On: 08/16/2015 22:23   Ct Cervical Spine Wo Contrast  08/16/2015  CLINICAL DATA:  Leg weakness, multiple falls, including 2 falls today. No loss of consciousness. EXAM: CT HEAD WITHOUT CONTRAST CT CERVICAL SPINE WITHOUT CONTRAST TECHNIQUE: Multidetector CT imaging of the head and cervical spine was performed following the standard protocol without intravenous contrast. Multiplanar CT image reconstructions of the cervical spine were also generated. COMPARISON:  None. FINDINGS: CT HEAD FINDINGS INTRACRANIAL CONTENTS: The ventricles and sulci are upper limits of normal for age. No intraparenchymal hemorrhage, mass effect nor midline shift. Patchy supratentorial white matter hypodensities are within normal range for patient's age and though non-specific likely represent chronic small vessel ischemic disease. No acute large vascular territory infarcts. No abnormal extra-axial fluid collections. Basal cisterns are patent. Mild calcific atherosclerosis of the carotid siphons. ORBITS: The included ocular globes and orbital contents are non-suspicious. SINUSES: The mastoid aircells and included paranasal sinuses are well-aerated. SKULL/SOFT TISSUES: No skull fracture. No significant soft tissue swelling. CT CERVICAL SPINE FINDINGS OSSEOUS STRUCTURES: Cervical vertebral bodies intact. Straightened cervical lordosis. Grade 1 C4-5 anterolisthesis with C5-6 auto interbody arthrodesis infused assessed on degenerative basis. Moderate C5-6 and C6-7 disc height loss. No destructive bony lesions. C1-2 articulation maintained with moderate arthropathy. Severe LEFT C4-5  facet arthropathy. No osseous canal stenosis. Severe LEFT C4-5 neural foraminal narrowing. SOFT TISSUES: Included prevertebral and paraspinal soft tissues are nonacute, mild calcific atherosclerosis of the carotid bulbs. IMPRESSION: CT HEAD: No acute intracranial process. Mild prominence of ventricles and sulci for age. Moderate chronic small vessel ischemic disease. CT CERVICAL SPINE: Straightened cervical lordosis without acute fracture.  Grade 1 C4-5 anterolisthesis on degenerative basis. Electronically Signed   By: Elon Alas M.D.   On: 08/16/2015 22:23   Dg Hip Unilat With Pelvis 2-3 Views Left  08/16/2015  CLINICAL DATA:  75 year old female with history of trauma from multiple falls today. Left hip pain. EXAM: DG HIP (WITH OR WITHOUT PELVIS) 2-3V LEFT COMPARISON:  No priors. FINDINGS: AP view of the pelvis and AP and lateral views of the left hip demonstrate no acute displaced fractures of the bony pelvic ring. Bilateral proximal femurs as visualized appear intact, and the left femoral head is properly located. There is mild joint space narrowing, subchondral sclerosis, subchondral cyst formation and osteophyte formation in the hip joints bilaterally, compatible with mild osteoarthritis. IMPRESSION: 1. No acute radiographic abnormality of the bony pelvis or left hip. 2. Mild bilateral hip joint osteoarthritis. Electronically Signed   By: Vinnie Langton M.D.   On: 08/16/2015 21:12   Dg Femur Min 2 Views Left  08/17/2015  CLINICAL DATA:  75 year old female with history of trauma from multiple falls recently. Left thigh pain. EXAM: LEFT FEMUR 2 VIEWS COMPARISON:  No priors. FINDINGS: There is no evidence of fracture or other focal bone lesions. Soft tissues are unremarkable. IMPRESSION: Negative. Electronically Signed   By: Vinnie Langton M.D.   On: 08/17/2015 15:01    Micro Results      No results found for this or any previous visit (from the past 240 hour(s)).     Today    Subjective    Kaydence Baldassarre today has no headache,no chest abdominal pain,no new weakness tingling or numbness, feels much better wants to go home today.     Objective   Blood pressure 136/79, pulse 70, temperature 98.1 F (36.7 C), temperature source Oral, resp. rate 20, height 5\' 3"  (1.6 m), weight 72.666 kg (160 lb 3.2 oz), SpO2 100 %.   Intake/Output Summary (Last 24 hours) at 08/18/15 0845 Last data filed at 08/17/15 1700  Gross per 24 hour  Intake    480 ml  Output      0 ml  Net    480 ml    Exam Awake Alert, Oriented x 3, No new F.N deficits, Normal affect [place].AT,PERRAL Supple Neck,No JVD, No cervical lymphadenopathy appriciated.  Symmetrical Chest wall movement, Good air movement bilaterally, CTAB RRR,No Gallops,Rubs or new Murmurs, No Parasternal Heave +ve B.Sounds, Abd Soft, Non tender, No organomegaly appriciated, No rebound -guarding or rigidity. No Cyanosis, Clubbing or edema, No new Rash or bruise   Data Review   CBC w Diff: Lab Results  Component Value Date   WBC 8.3 08/17/2015   HGB 10.0* 08/17/2015   HCT 29.5* 08/17/2015   PLT 255 08/17/2015   LYMPHOPCT 38 08/17/2015   MONOPCT 10 08/17/2015   EOSPCT 1 08/17/2015   BASOPCT 0 08/17/2015    CMP: Lab Results  Component Value Date   NA 135 08/18/2015   K 3.9 08/18/2015   CL 108 08/18/2015   CO2 23 08/18/2015   BUN 20 08/18/2015   CREATININE 1.35* 08/18/2015   PROT 5.1* 08/17/2015   ALBUMIN 2.9* 08/17/2015   BILITOT 1.2 08/17/2015   ALKPHOS 44 08/17/2015   AST 21 08/17/2015   ALT 15 08/17/2015  .   Total Time in preparing paper work, data evaluation and todays exam - 35 minutes  Thurnell Lose M.D on 08/18/2015 at 8:45 AM  [facility]   Office  [phone]

## 2015-08-18 NOTE — Discharge Instructions (Signed)
Follow with Primary MD Arsenio Katz, NP in 3 days   Get CBC, CMP, 2 view Chest X ray checked  by Primary MD next visit.    Activity: As tolerated with Full fall precautions use walker/cane & assistance as needed   Disposition Home     Diet:   Heart Healthy  .  For Heart failure patients - Check your Weight same time everyday, if you gain over 2 pounds, or you develop in leg swelling, experience more shortness of breath or chest pain, call your Primary MD immediately. Follow Cardiac Low Salt Diet and 1.5 lit/day fluid restriction.   On your next visit with your primary care physician please Get Medicines reviewed and adjusted.   Please request your Prim.MD to go over all Hospital Tests and Procedure/Radiological results at the follow up, please get all Hospital records sent to your Prim MD by signing hospital release before you go home.   If you experience worsening of your admission symptoms, develop shortness of breath, life threatening emergency, suicidal or homicidal thoughts you must seek medical attention immediately by calling 911 or calling your MD immediately  if symptoms less severe.  You Must read complete instructions/literature along with all the possible adverse reactions/side effects for all the Medicines you take and that have been prescribed to you. Take any new Medicines after you have completely understood and accpet all the possible adverse reactions/side effects.   Do not drive, operating heavy machinery, perform activities at heights, swimming or participation in water activities or provide baby sitting services if your were admitted for syncope or siezures until you have seen by Primary MD or a Neurologist and advised to do so again.  Do not drive when taking Pain medications.    Do not take more than prescribed Pain, Sleep and Anxiety Medications  Special Instructions: If you have smoked or chewed Tobacco  in the last 2 yrs please stop smoking, stop any  regular Alcohol  and or any Recreational drug use.  Wear Seat belts while driving.   Please note  You were cared for by a hospitalist during your hospital stay. If you have any questions about your discharge medications or the care you received while you were in the hospital after you are discharged, you can call the unit and asked to speak with the hospitalist on call if the hospitalist that took care of you is not available. Once you are discharged, your primary care physician will handle any further medical issues. Please note that NO REFILLS for any discharge medications will be authorized once you are discharged, as it is imperative that you return to your primary care physician (or establish a relationship with a primary care physician if you do not have one) for your aftercare needs so that they can reassess your need for medications and monitor your lab values.

## 2015-08-18 NOTE — Progress Notes (Signed)
Patient with orders to be discharge home. Discharge instructions given, patient verbalized understanding. Patient stable. Patient left in private vehicle with family.  

## 2015-08-21 LAB — URINE CULTURE: Culture: 60000 — AB

## 2015-08-23 DIAGNOSIS — R2681 Unsteadiness on feet: Secondary | ICD-10-CM | POA: Diagnosis not present

## 2015-08-23 DIAGNOSIS — F329 Major depressive disorder, single episode, unspecified: Secondary | ICD-10-CM | POA: Diagnosis not present

## 2015-08-23 DIAGNOSIS — I10 Essential (primary) hypertension: Secondary | ICD-10-CM | POA: Diagnosis not present

## 2015-08-23 DIAGNOSIS — E039 Hypothyroidism, unspecified: Secondary | ICD-10-CM | POA: Diagnosis not present

## 2015-08-30 DIAGNOSIS — R2689 Other abnormalities of gait and mobility: Secondary | ICD-10-CM | POA: Diagnosis not present

## 2015-08-30 DIAGNOSIS — M6281 Muscle weakness (generalized): Secondary | ICD-10-CM | POA: Diagnosis not present

## 2015-08-30 DIAGNOSIS — R260 Ataxic gait: Secondary | ICD-10-CM | POA: Diagnosis not present

## 2015-09-04 DIAGNOSIS — M6281 Muscle weakness (generalized): Secondary | ICD-10-CM | POA: Diagnosis not present

## 2015-09-04 DIAGNOSIS — R260 Ataxic gait: Secondary | ICD-10-CM | POA: Diagnosis not present

## 2015-09-04 DIAGNOSIS — R2689 Other abnormalities of gait and mobility: Secondary | ICD-10-CM | POA: Diagnosis not present

## 2015-09-06 DIAGNOSIS — R260 Ataxic gait: Secondary | ICD-10-CM | POA: Diagnosis not present

## 2015-09-06 DIAGNOSIS — R2689 Other abnormalities of gait and mobility: Secondary | ICD-10-CM | POA: Diagnosis not present

## 2015-09-06 DIAGNOSIS — M6281 Muscle weakness (generalized): Secondary | ICD-10-CM | POA: Diagnosis not present

## 2015-09-11 DIAGNOSIS — R260 Ataxic gait: Secondary | ICD-10-CM | POA: Diagnosis not present

## 2015-09-11 DIAGNOSIS — R2689 Other abnormalities of gait and mobility: Secondary | ICD-10-CM | POA: Diagnosis not present

## 2015-09-11 DIAGNOSIS — M6281 Muscle weakness (generalized): Secondary | ICD-10-CM | POA: Diagnosis not present

## 2015-09-13 DIAGNOSIS — R2689 Other abnormalities of gait and mobility: Secondary | ICD-10-CM | POA: Diagnosis not present

## 2015-09-13 DIAGNOSIS — M6281 Muscle weakness (generalized): Secondary | ICD-10-CM | POA: Diagnosis not present

## 2015-09-13 DIAGNOSIS — R260 Ataxic gait: Secondary | ICD-10-CM | POA: Diagnosis not present

## 2015-09-17 DIAGNOSIS — M6281 Muscle weakness (generalized): Secondary | ICD-10-CM | POA: Diagnosis not present

## 2015-09-17 DIAGNOSIS — R260 Ataxic gait: Secondary | ICD-10-CM | POA: Diagnosis not present

## 2015-09-17 DIAGNOSIS — R2689 Other abnormalities of gait and mobility: Secondary | ICD-10-CM | POA: Diagnosis not present

## 2015-09-20 DIAGNOSIS — Z0000 Encounter for general adult medical examination without abnormal findings: Secondary | ICD-10-CM | POA: Diagnosis not present

## 2015-09-20 DIAGNOSIS — Z7189 Other specified counseling: Secondary | ICD-10-CM | POA: Diagnosis not present

## 2015-09-20 DIAGNOSIS — Z6826 Body mass index (BMI) 26.0-26.9, adult: Secondary | ICD-10-CM | POA: Diagnosis not present

## 2015-09-20 DIAGNOSIS — R5383 Other fatigue: Secondary | ICD-10-CM | POA: Diagnosis not present

## 2015-09-20 DIAGNOSIS — M6281 Muscle weakness (generalized): Secondary | ICD-10-CM | POA: Diagnosis not present

## 2015-09-20 DIAGNOSIS — Z1389 Encounter for screening for other disorder: Secondary | ICD-10-CM | POA: Diagnosis not present

## 2015-09-20 DIAGNOSIS — Z79899 Other long term (current) drug therapy: Secondary | ICD-10-CM | POA: Diagnosis not present

## 2015-09-20 DIAGNOSIS — I10 Essential (primary) hypertension: Secondary | ICD-10-CM | POA: Diagnosis not present

## 2015-09-20 DIAGNOSIS — Z1211 Encounter for screening for malignant neoplasm of colon: Secondary | ICD-10-CM | POA: Diagnosis not present

## 2015-09-20 DIAGNOSIS — R260 Ataxic gait: Secondary | ICD-10-CM | POA: Diagnosis not present

## 2015-09-20 DIAGNOSIS — R2689 Other abnormalities of gait and mobility: Secondary | ICD-10-CM | POA: Diagnosis not present

## 2015-09-20 DIAGNOSIS — Z299 Encounter for prophylactic measures, unspecified: Secondary | ICD-10-CM | POA: Diagnosis not present

## 2015-09-23 DIAGNOSIS — R2689 Other abnormalities of gait and mobility: Secondary | ICD-10-CM | POA: Diagnosis not present

## 2015-09-23 DIAGNOSIS — R260 Ataxic gait: Secondary | ICD-10-CM | POA: Diagnosis not present

## 2015-09-23 DIAGNOSIS — M6281 Muscle weakness (generalized): Secondary | ICD-10-CM | POA: Diagnosis not present

## 2015-09-26 DIAGNOSIS — M6281 Muscle weakness (generalized): Secondary | ICD-10-CM | POA: Diagnosis not present

## 2015-09-26 DIAGNOSIS — R2689 Other abnormalities of gait and mobility: Secondary | ICD-10-CM | POA: Diagnosis not present

## 2015-09-26 DIAGNOSIS — R260 Ataxic gait: Secondary | ICD-10-CM | POA: Diagnosis not present

## 2015-10-02 DIAGNOSIS — M6281 Muscle weakness (generalized): Secondary | ICD-10-CM | POA: Diagnosis not present

## 2015-10-02 DIAGNOSIS — R260 Ataxic gait: Secondary | ICD-10-CM | POA: Diagnosis not present

## 2015-10-02 DIAGNOSIS — R2689 Other abnormalities of gait and mobility: Secondary | ICD-10-CM | POA: Diagnosis not present

## 2015-10-04 DIAGNOSIS — M6281 Muscle weakness (generalized): Secondary | ICD-10-CM | POA: Diagnosis not present

## 2015-10-04 DIAGNOSIS — R2689 Other abnormalities of gait and mobility: Secondary | ICD-10-CM | POA: Diagnosis not present

## 2015-10-04 DIAGNOSIS — R260 Ataxic gait: Secondary | ICD-10-CM | POA: Diagnosis not present

## 2015-10-07 DIAGNOSIS — M6281 Muscle weakness (generalized): Secondary | ICD-10-CM | POA: Diagnosis not present

## 2015-10-07 DIAGNOSIS — R2689 Other abnormalities of gait and mobility: Secondary | ICD-10-CM | POA: Diagnosis not present

## 2015-10-07 DIAGNOSIS — R260 Ataxic gait: Secondary | ICD-10-CM | POA: Diagnosis not present

## 2015-10-10 DIAGNOSIS — R260 Ataxic gait: Secondary | ICD-10-CM | POA: Diagnosis not present

## 2015-10-10 DIAGNOSIS — R2689 Other abnormalities of gait and mobility: Secondary | ICD-10-CM | POA: Diagnosis not present

## 2015-10-10 DIAGNOSIS — M6281 Muscle weakness (generalized): Secondary | ICD-10-CM | POA: Diagnosis not present

## 2015-10-11 DIAGNOSIS — G309 Alzheimer's disease, unspecified: Secondary | ICD-10-CM | POA: Diagnosis not present

## 2015-10-11 DIAGNOSIS — I10 Essential (primary) hypertension: Secondary | ICD-10-CM | POA: Diagnosis not present

## 2015-10-11 DIAGNOSIS — M159 Polyosteoarthritis, unspecified: Secondary | ICD-10-CM | POA: Diagnosis not present

## 2015-10-15 DIAGNOSIS — R2689 Other abnormalities of gait and mobility: Secondary | ICD-10-CM | POA: Diagnosis not present

## 2015-10-15 DIAGNOSIS — R260 Ataxic gait: Secondary | ICD-10-CM | POA: Diagnosis not present

## 2015-10-15 DIAGNOSIS — M6281 Muscle weakness (generalized): Secondary | ICD-10-CM | POA: Diagnosis not present

## 2015-10-17 DIAGNOSIS — R2689 Other abnormalities of gait and mobility: Secondary | ICD-10-CM | POA: Diagnosis not present

## 2015-10-17 DIAGNOSIS — M6281 Muscle weakness (generalized): Secondary | ICD-10-CM | POA: Diagnosis not present

## 2015-10-17 DIAGNOSIS — R260 Ataxic gait: Secondary | ICD-10-CM | POA: Diagnosis not present

## 2015-10-21 DIAGNOSIS — R2681 Unsteadiness on feet: Secondary | ICD-10-CM | POA: Diagnosis not present

## 2015-10-21 DIAGNOSIS — R2689 Other abnormalities of gait and mobility: Secondary | ICD-10-CM | POA: Diagnosis not present

## 2015-10-21 DIAGNOSIS — G309 Alzheimer's disease, unspecified: Secondary | ICD-10-CM | POA: Diagnosis not present

## 2015-10-21 DIAGNOSIS — M5440 Lumbago with sciatica, unspecified side: Secondary | ICD-10-CM | POA: Diagnosis not present

## 2015-10-21 DIAGNOSIS — R260 Ataxic gait: Secondary | ICD-10-CM | POA: Diagnosis not present

## 2015-10-21 DIAGNOSIS — M6281 Muscle weakness (generalized): Secondary | ICD-10-CM | POA: Diagnosis not present

## 2015-10-24 DIAGNOSIS — M6281 Muscle weakness (generalized): Secondary | ICD-10-CM | POA: Diagnosis not present

## 2015-10-24 DIAGNOSIS — N183 Chronic kidney disease, stage 3 (moderate): Secondary | ICD-10-CM | POA: Diagnosis not present

## 2015-10-24 DIAGNOSIS — R2689 Other abnormalities of gait and mobility: Secondary | ICD-10-CM | POA: Diagnosis not present

## 2015-10-24 DIAGNOSIS — R260 Ataxic gait: Secondary | ICD-10-CM | POA: Diagnosis not present

## 2015-10-28 DIAGNOSIS — R2689 Other abnormalities of gait and mobility: Secondary | ICD-10-CM | POA: Diagnosis not present

## 2015-10-28 DIAGNOSIS — M6281 Muscle weakness (generalized): Secondary | ICD-10-CM | POA: Diagnosis not present

## 2015-10-28 DIAGNOSIS — R260 Ataxic gait: Secondary | ICD-10-CM | POA: Diagnosis not present

## 2015-11-04 DIAGNOSIS — R260 Ataxic gait: Secondary | ICD-10-CM | POA: Diagnosis not present

## 2015-11-04 DIAGNOSIS — R2689 Other abnormalities of gait and mobility: Secondary | ICD-10-CM | POA: Diagnosis not present

## 2015-11-04 DIAGNOSIS — M6281 Muscle weakness (generalized): Secondary | ICD-10-CM | POA: Diagnosis not present

## 2015-11-08 DIAGNOSIS — M6281 Muscle weakness (generalized): Secondary | ICD-10-CM | POA: Diagnosis not present

## 2015-11-08 DIAGNOSIS — R260 Ataxic gait: Secondary | ICD-10-CM | POA: Diagnosis not present

## 2015-11-08 DIAGNOSIS — R2689 Other abnormalities of gait and mobility: Secondary | ICD-10-CM | POA: Diagnosis not present

## 2015-11-21 DIAGNOSIS — M545 Low back pain: Secondary | ICD-10-CM | POA: Diagnosis not present

## 2015-11-21 DIAGNOSIS — R2681 Unsteadiness on feet: Secondary | ICD-10-CM | POA: Diagnosis not present

## 2015-11-21 DIAGNOSIS — G309 Alzheimer's disease, unspecified: Secondary | ICD-10-CM | POA: Diagnosis not present

## 2015-11-21 DIAGNOSIS — Z299 Encounter for prophylactic measures, unspecified: Secondary | ICD-10-CM | POA: Diagnosis not present

## 2015-11-26 DIAGNOSIS — M6281 Muscle weakness (generalized): Secondary | ICD-10-CM | POA: Diagnosis not present

## 2015-11-26 DIAGNOSIS — R260 Ataxic gait: Secondary | ICD-10-CM | POA: Diagnosis not present

## 2015-11-26 DIAGNOSIS — R2689 Other abnormalities of gait and mobility: Secondary | ICD-10-CM | POA: Diagnosis not present

## 2015-12-02 DIAGNOSIS — R2689 Other abnormalities of gait and mobility: Secondary | ICD-10-CM | POA: Diagnosis not present

## 2015-12-02 DIAGNOSIS — R260 Ataxic gait: Secondary | ICD-10-CM | POA: Diagnosis not present

## 2015-12-02 DIAGNOSIS — M6281 Muscle weakness (generalized): Secondary | ICD-10-CM | POA: Diagnosis not present

## 2015-12-04 DIAGNOSIS — R260 Ataxic gait: Secondary | ICD-10-CM | POA: Diagnosis not present

## 2015-12-04 DIAGNOSIS — R2689 Other abnormalities of gait and mobility: Secondary | ICD-10-CM | POA: Diagnosis not present

## 2015-12-04 DIAGNOSIS — M6281 Muscle weakness (generalized): Secondary | ICD-10-CM | POA: Diagnosis not present

## 2015-12-23 DIAGNOSIS — R2689 Other abnormalities of gait and mobility: Secondary | ICD-10-CM | POA: Diagnosis not present

## 2015-12-23 DIAGNOSIS — R260 Ataxic gait: Secondary | ICD-10-CM | POA: Diagnosis not present

## 2015-12-23 DIAGNOSIS — M6281 Muscle weakness (generalized): Secondary | ICD-10-CM | POA: Diagnosis not present

## 2015-12-27 DIAGNOSIS — M6281 Muscle weakness (generalized): Secondary | ICD-10-CM | POA: Diagnosis not present

## 2015-12-27 DIAGNOSIS — R260 Ataxic gait: Secondary | ICD-10-CM | POA: Diagnosis not present

## 2015-12-27 DIAGNOSIS — R2689 Other abnormalities of gait and mobility: Secondary | ICD-10-CM | POA: Diagnosis not present

## 2015-12-30 DIAGNOSIS — M5440 Lumbago with sciatica, unspecified side: Secondary | ICD-10-CM | POA: Diagnosis not present

## 2015-12-30 DIAGNOSIS — G309 Alzheimer's disease, unspecified: Secondary | ICD-10-CM | POA: Diagnosis not present

## 2015-12-30 DIAGNOSIS — Z789 Other specified health status: Secondary | ICD-10-CM | POA: Diagnosis not present

## 2015-12-30 DIAGNOSIS — I10 Essential (primary) hypertension: Secondary | ICD-10-CM | POA: Diagnosis not present

## 2015-12-30 DIAGNOSIS — J069 Acute upper respiratory infection, unspecified: Secondary | ICD-10-CM | POA: Diagnosis not present

## 2015-12-30 DIAGNOSIS — Z6827 Body mass index (BMI) 27.0-27.9, adult: Secondary | ICD-10-CM | POA: Diagnosis not present

## 2016-01-10 DIAGNOSIS — R260 Ataxic gait: Secondary | ICD-10-CM | POA: Diagnosis not present

## 2016-01-10 DIAGNOSIS — M6281 Muscle weakness (generalized): Secondary | ICD-10-CM | POA: Diagnosis not present

## 2016-01-10 DIAGNOSIS — R2689 Other abnormalities of gait and mobility: Secondary | ICD-10-CM | POA: Diagnosis not present

## 2016-01-14 DIAGNOSIS — R260 Ataxic gait: Secondary | ICD-10-CM | POA: Diagnosis not present

## 2016-01-14 DIAGNOSIS — R2689 Other abnormalities of gait and mobility: Secondary | ICD-10-CM | POA: Diagnosis not present

## 2016-01-14 DIAGNOSIS — M6281 Muscle weakness (generalized): Secondary | ICD-10-CM | POA: Diagnosis not present

## 2016-01-15 DIAGNOSIS — Z23 Encounter for immunization: Secondary | ICD-10-CM | POA: Diagnosis not present

## 2016-01-16 DIAGNOSIS — M6281 Muscle weakness (generalized): Secondary | ICD-10-CM | POA: Diagnosis not present

## 2016-01-16 DIAGNOSIS — R260 Ataxic gait: Secondary | ICD-10-CM | POA: Diagnosis not present

## 2016-01-16 DIAGNOSIS — R2689 Other abnormalities of gait and mobility: Secondary | ICD-10-CM | POA: Diagnosis not present

## 2016-01-20 DIAGNOSIS — R260 Ataxic gait: Secondary | ICD-10-CM | POA: Diagnosis not present

## 2016-01-20 DIAGNOSIS — R2689 Other abnormalities of gait and mobility: Secondary | ICD-10-CM | POA: Diagnosis not present

## 2016-01-20 DIAGNOSIS — M6281 Muscle weakness (generalized): Secondary | ICD-10-CM | POA: Diagnosis not present

## 2016-01-23 DIAGNOSIS — R260 Ataxic gait: Secondary | ICD-10-CM | POA: Diagnosis not present

## 2016-01-23 DIAGNOSIS — R2689 Other abnormalities of gait and mobility: Secondary | ICD-10-CM | POA: Diagnosis not present

## 2016-01-23 DIAGNOSIS — M6281 Muscle weakness (generalized): Secondary | ICD-10-CM | POA: Diagnosis not present

## 2016-01-29 DIAGNOSIS — Z299 Encounter for prophylactic measures, unspecified: Secondary | ICD-10-CM | POA: Diagnosis not present

## 2016-01-29 DIAGNOSIS — Z713 Dietary counseling and surveillance: Secondary | ICD-10-CM | POA: Diagnosis not present

## 2016-01-29 DIAGNOSIS — G309 Alzheimer's disease, unspecified: Secondary | ICD-10-CM | POA: Diagnosis not present

## 2016-01-29 DIAGNOSIS — Z6826 Body mass index (BMI) 26.0-26.9, adult: Secondary | ICD-10-CM | POA: Diagnosis not present

## 2016-01-29 DIAGNOSIS — I10 Essential (primary) hypertension: Secondary | ICD-10-CM | POA: Diagnosis not present

## 2016-02-07 DIAGNOSIS — R531 Weakness: Secondary | ICD-10-CM | POA: Diagnosis not present

## 2016-02-07 DIAGNOSIS — Z789 Other specified health status: Secondary | ICD-10-CM | POA: Diagnosis not present

## 2016-02-07 DIAGNOSIS — R05 Cough: Secondary | ICD-10-CM | POA: Diagnosis not present

## 2016-02-07 DIAGNOSIS — Z299 Encounter for prophylactic measures, unspecified: Secondary | ICD-10-CM | POA: Diagnosis not present

## 2016-02-07 DIAGNOSIS — R0602 Shortness of breath: Secondary | ICD-10-CM | POA: Diagnosis not present

## 2016-02-07 DIAGNOSIS — M545 Low back pain: Secondary | ICD-10-CM | POA: Diagnosis not present

## 2016-02-17 DIAGNOSIS — R0602 Shortness of breath: Secondary | ICD-10-CM | POA: Diagnosis not present

## 2016-02-18 DIAGNOSIS — M159 Polyosteoarthritis, unspecified: Secondary | ICD-10-CM | POA: Diagnosis not present

## 2016-02-18 DIAGNOSIS — G309 Alzheimer's disease, unspecified: Secondary | ICD-10-CM | POA: Diagnosis not present

## 2016-02-18 DIAGNOSIS — I10 Essential (primary) hypertension: Secondary | ICD-10-CM | POA: Diagnosis not present

## 2016-03-27 DIAGNOSIS — I10 Essential (primary) hypertension: Secondary | ICD-10-CM | POA: Diagnosis not present

## 2016-03-27 DIAGNOSIS — R079 Chest pain, unspecified: Secondary | ICD-10-CM | POA: Diagnosis not present

## 2016-03-27 DIAGNOSIS — R0600 Dyspnea, unspecified: Secondary | ICD-10-CM | POA: Diagnosis not present

## 2016-03-27 DIAGNOSIS — Z299 Encounter for prophylactic measures, unspecified: Secondary | ICD-10-CM | POA: Diagnosis not present

## 2016-03-27 DIAGNOSIS — G309 Alzheimer's disease, unspecified: Secondary | ICD-10-CM | POA: Diagnosis not present

## 2016-04-09 ENCOUNTER — Encounter: Payer: Self-pay | Admitting: *Deleted

## 2016-04-10 ENCOUNTER — Encounter: Payer: Self-pay | Admitting: *Deleted

## 2016-04-10 ENCOUNTER — Encounter: Payer: Self-pay | Admitting: Cardiovascular Disease

## 2016-04-10 ENCOUNTER — Ambulatory Visit (INDEPENDENT_AMBULATORY_CARE_PROVIDER_SITE_OTHER): Payer: Medicare Other | Admitting: Cardiovascular Disease

## 2016-04-10 VITALS — BP 133/80 | HR 67 | Ht 63.0 in | Wt 145.0 lb

## 2016-04-10 DIAGNOSIS — R000 Tachycardia, unspecified: Secondary | ICD-10-CM | POA: Diagnosis not present

## 2016-04-10 DIAGNOSIS — G309 Alzheimer's disease, unspecified: Secondary | ICD-10-CM | POA: Diagnosis not present

## 2016-04-10 DIAGNOSIS — R0609 Other forms of dyspnea: Secondary | ICD-10-CM | POA: Diagnosis not present

## 2016-04-10 DIAGNOSIS — I10 Essential (primary) hypertension: Secondary | ICD-10-CM | POA: Diagnosis not present

## 2016-04-10 DIAGNOSIS — M159 Polyosteoarthritis, unspecified: Secondary | ICD-10-CM | POA: Diagnosis not present

## 2016-04-10 NOTE — Progress Notes (Signed)
CARDIOLOGY CONSULT NOTE  Patient ID: Monica Herman MRN: [id number] DOB/AGE: 05-30-1940 76 y.o.  Admit date: (Not on file) Primary Physician: Arsenio Katz, NP Referring Physician: Manuella Ghazi  Reason for Consultation: SOB  HPI: The patient is a 76 year old woman referred for the evaluation of dyspnea. Echocardiogram performed at an outside facility on 02/17/16 reportedly demonstrated normal left ventricular systolic function and diastolic dysfunction. There was mild mitral and tricuspid regurgitation by report.  ECG in May 2017 showed normal normal sinus rhythm with no ischemic ST segment or T-wave abnormalities, nor any arrhythmias.  Chest x-ray on 08/16/15 showed no evidence of acute cardiopulmonary disease.  She began experiencing some mild exertional dyspnea approximately one week before Christmas. She has no history of lung disease. She is a nonsmoker. She also has sensation of her "heart racing". She is not sure if this came first or shortness of breath. She denies exertional chest tightness. She denies palpitations at rest. She also denies leg swelling and syncope.    No Known Allergies  Current Outpatient Prescriptions  Medication Sig Dispense Refill  . alendronate (FOSAMAX) 70 MG tablet Take 1 tablet by mouth every Wednesday.  12  . aspirin EC 81 MG tablet Take 81 mg by mouth every morning.    Marland Kitchen atenolol (TENORMIN) 50 MG tablet Take 1 tablet (50 mg total) by mouth daily. 60 tablet 0  . citalopram (CELEXA) 20 MG tablet Take 20 mg by mouth daily.  2  . clonazePAM (KLONOPIN) 0.5 MG tablet Take 1 tablet by mouth 2 (two) times daily.  2  . levothyroxine (SYNTHROID, LEVOTHROID) 75 MCG tablet Take 37.5 mcg by mouth daily.  3  . Memantine HCl-Donepezil HCl (NAMZARIC) 28-10 MG CP24 Take 1 tablet by mouth daily.     Marland Kitchen oxyCODONE-acetaminophen (PERCOCET/ROXICET) 5-325 MG tablet Take 1 tablet by mouth 3 (three) times daily as needed. For pain  0  . potassium chloride (K-DUR) 10 MEQ  tablet Take 10 mEq by mouth every morning.  2   No current facility-administered medications for this visit.     Past Medical History:  Diagnosis Date  . Alzheimer disease   . Hypertension   . Osteoporosis   . Thyroid disease   . Unsteady gait     Past Surgical History:  Procedure Laterality Date  . ABDOMINAL HYSTERECTOMY    . CHOLECYSTECTOMY    . COLONOSCOPY    . FRACTURE SURGERY      Social History   Social History  . Marital status: Married    Spouse name: N/A  . Number of children: N/A  . Years of education: N/A   Occupational History  . Not on file.   Social History Main Topics  . Smoking status: Never Smoker  . Smokeless tobacco: Never Used  . Alcohol use No  . Drug use: No  . Sexual activity: Not on file   Other Topics Concern  . Not on file   Social History Narrative  . No narrative on file     No family history of premature CAD in 1st degree relatives.  Prior to Admission medications   Medication Sig Start Date End Date Taking? Authorizing Provider  alendronate (FOSAMAX) 70 MG tablet Take 1 tablet by mouth every Wednesday. 07/26/15  Yes Historical Provider, MD  aspirin EC 81 MG tablet Take 81 mg by mouth every morning.   Yes Historical Provider, MD  atenolol (TENORMIN) 50 MG tablet Take 1 tablet (50 mg total) by mouth  daily. 08/18/15  Yes Thurnell Lose, MD  citalopram (CELEXA) 20 MG tablet Take 20 mg by mouth daily. 03/16/16  Yes Historical Provider, MD  clonazePAM (KLONOPIN) 0.5 MG tablet Take 1 tablet by mouth 2 (two) times daily. 07/26/15  Yes Historical Provider, MD  levothyroxine (SYNTHROID, LEVOTHROID) 75 MCG tablet Take 37.5 mcg by mouth daily. 03/16/16  Yes Historical Provider, MD  Memantine HCl-Donepezil HCl (NAMZARIC) 28-10 MG CP24 Take 1 tablet by mouth daily.  01/29/16  Yes Historical Provider, MD  oxyCODONE-acetaminophen (PERCOCET/ROXICET) 5-325 MG tablet Take 1 tablet by mouth 3 (three) times daily as needed. For pain 07/26/15  Yes  Historical Provider, MD  potassium chloride (K-DUR) 10 MEQ tablet Take 10 mEq by mouth every morning. 07/26/15  Yes Historical Provider, MD     Review of systems complete and found to be negative unless listed above in HPI     Physical exam Blood pressure 133/80, pulse 67, height 5\' 3"  (1.6 m), weight 145 lb (65.8 kg). General: NAD Neck: No JVD, no thyromegaly or thyroid nodule.  Lungs: Clear to auscultation bilaterally with normal respiratory effort. CV: Nondisplaced PMI. Regular rate and rhythm, normal S1/S2, no S3/S4, no murmur.  No peripheral edema.  No carotid bruit.    Abdomen: Soft, nontender, no distention.  Skin: Intact without lesions or rashes.  Neurologic: Alert and oriented x 3.  Psych: Normal affect. Extremities: No clubbing or cyanosis.  HEENT: Normal.   ECG: Most recent ECG reviewed.  Labs:   Lab Results  Component Value Date   WBC 8.3 08/17/2015   HGB 10.0 (L) 08/17/2015   HCT 29.5 (L) 08/17/2015   MCV 98.0 08/17/2015   PLT 255 08/17/2015   No results for input(s): NA, K, CL, CO2, BUN, CREATININE, CALCIUM, PROT, BILITOT, ALKPHOS, ALT, AST, GLUCOSE in the last 168 hours.  Invalid input(s): LABALBU Lab Results  Component Value Date   [id number] (H) 08/17/2015   TROPONINI <0.03 08/17/2015   No results found for: CHOL No results found for: HDL No results found for: LDLCALC No results found for: TRIG No results found for: CHOLHDL No results found for: LDLDIRECT       Studies: No results found.  ASSESSMENT AND PLAN:  1. Exertional dyspnea with palpitations: Symptoms are new for her. She has no known pulmonary disease. ECG in May 2017 was normal. Echocardiogram demonstrated normal left ventricular systolic function.  I will proceed with a nuclear myocardial perfusion imaging study to evaluate for ischemic heart disease (Lexiscan Myoview).  Dispo: fu 6 wks   Signed: Kate Sable, M.D., F.A.C.C.  04/10/2016, 2:37 PM

## 2016-04-10 NOTE — Patient Instructions (Signed)
Medication Instructions:  Continue all current medications.  Labwork: none  Testing/Procedures:  Your physician has requested that you have a lexiscan myoview. For further information please visit [email or web address]. Please follow instruction sheet, as given.  Office will contact with results via phone or letter.    Follow-Up: 6 weeks   Any Other Special Instructions Will Be Listed Below (If Applicable).  If you need a refill on your cardiac medications before your next appointment, please call your pharmacy.  

## 2016-04-16 ENCOUNTER — Encounter (HOSPITAL_COMMUNITY): Payer: Self-pay

## 2016-04-16 ENCOUNTER — Inpatient Hospital Stay (HOSPITAL_COMMUNITY): Admission: RE | Admit: 2016-04-16 | Payer: Medicare Other | Source: Ambulatory Visit

## 2016-04-16 ENCOUNTER — Encounter (HOSPITAL_COMMUNITY)
Admission: RE | Admit: 2016-04-16 | Discharge: 2016-04-16 | Disposition: A | Discharge status: Home / Self Care | Payer: Medicare Other | Source: Ambulatory Visit | Attending: Cardiovascular Disease | Admitting: Cardiovascular Disease

## 2016-04-16 DIAGNOSIS — R0609 Other forms of dyspnea: Secondary | ICD-10-CM | POA: Diagnosis not present

## 2016-04-16 LAB — NM MYOCAR MULTI W/SPECT W/WALL MOTION / EF
CHL CUP NUCLEAR SDS: 0
CHL CUP NUCLEAR SRS: 2
CHL CUP RESTING HR STRESS: 65 {beats}/min
CSEPPHR: 89 {beats}/min
LHR: 0
LV dias vol: 33 mL (ref 46–106)
LV sys vol: 7 mL
SSS: 2
TID: 1.03

## 2016-04-16 MED ORDER — TECHNETIUM TC 99M TETROFOSMIN IV KIT
30.0000 | PACK | Freq: Once | INTRAVENOUS | Status: AC | PRN
  Administered 2016-04-16: 30 via INTRAVENOUS

## 2016-04-16 MED ORDER — REGADENOSON 0.4 MG/5ML IV SOLN
INTRAVENOUS | Status: AC
  Administered 2016-04-16: 0.4 mg via INTRAVENOUS
  Filled 2016-04-16: qty 5

## 2016-04-16 MED ORDER — SODIUM CHLORIDE 0.9% FLUSH
INTRAVENOUS | Status: AC
  Administered 2016-04-16: 10 mL via INTRAVENOUS
  Filled 2016-04-16: qty 10

## 2016-04-16 MED ORDER — TECHNETIUM TC 99M TETROFOSMIN IV KIT
10.0000 | PACK | Freq: Once | INTRAVENOUS | Status: AC | PRN
  Administered 2016-04-16: 10.1 via INTRAVENOUS

## 2016-04-21 ENCOUNTER — Encounter: Payer: Self-pay | Admitting: *Deleted

## 2016-05-08 DIAGNOSIS — G309 Alzheimer's disease, unspecified: Secondary | ICD-10-CM | POA: Diagnosis not present

## 2016-05-08 DIAGNOSIS — I10 Essential (primary) hypertension: Secondary | ICD-10-CM | POA: Diagnosis not present

## 2016-05-08 DIAGNOSIS — M159 Polyosteoarthritis, unspecified: Secondary | ICD-10-CM | POA: Diagnosis not present

## 2016-05-22 ENCOUNTER — Encounter: Payer: Self-pay | Admitting: Cardiovascular Disease

## 2016-05-22 ENCOUNTER — Ambulatory Visit (INDEPENDENT_AMBULATORY_CARE_PROVIDER_SITE_OTHER): Payer: Medicare Other | Admitting: Cardiovascular Disease

## 2016-05-22 VITALS — BP 122/70 | HR 66 | Ht 64.0 in | Wt 144.0 lb

## 2016-05-22 DIAGNOSIS — Z136 Encounter for screening for cardiovascular disorders: Secondary | ICD-10-CM | POA: Diagnosis not present

## 2016-05-22 DIAGNOSIS — R0609 Other forms of dyspnea: Secondary | ICD-10-CM

## 2016-05-22 DIAGNOSIS — R000 Tachycardia, unspecified: Secondary | ICD-10-CM | POA: Diagnosis not present

## 2016-05-22 DIAGNOSIS — IMO0001 Reserved for inherently not codable concepts without codable children: Secondary | ICD-10-CM

## 2016-05-22 NOTE — Patient Instructions (Signed)
Your physician recommends that you schedule a follow-up appointment in: As needed with Dr. Bronson Ing.   Your physician recommends that you continue on your current medications as directed. Please refer to the Current Medication list given to you today.     Thank you for choosing [facility]!!

## 2016-05-22 NOTE — Progress Notes (Signed)
SUBJECTIVE: The patient returns for follow-up after undergoing cardiovascular testing performed for the evaluation of shortness of breath and palpitations.  Nuclear stress testing on 04/16/16 showed no evidence of myocardial ischemia or scar. Soft tissue attenuation artifact was noted. Calculated LVEF 79%. It was a low risk study.  She has some exertional dyspnea and palpitations if she "overdoes it ". This can occur while vacuuming which her husband now does. She denies exertional chest pain. She would like to get back to exercising.   Review of Systems: As per "subjective", otherwise negative.  No Known Allergies  Current Outpatient Prescriptions  Medication Sig Dispense Refill  . alendronate (FOSAMAX) 70 MG tablet Take 1 tablet by mouth every Wednesday.  12  . aspirin EC 81 MG tablet Take 81 mg by mouth every morning.    Marland Kitchen atenolol (TENORMIN) 50 MG tablet Take 1 tablet (50 mg total) by mouth daily. 60 tablet 0  . citalopram (CELEXA) 20 MG tablet Take 20 mg by mouth daily.  2  . clonazePAM (KLONOPIN) 0.5 MG tablet Take 1 tablet by mouth 2 (two) times daily.  2  . levothyroxine (SYNTHROID, LEVOTHROID) 75 MCG tablet Take 37.5 mcg by mouth daily.  3  . Memantine HCl-Donepezil HCl (NAMZARIC) 28-10 MG CP24 Take 1 tablet by mouth daily.     Marland Kitchen oxyCODONE-acetaminophen (PERCOCET/ROXICET) 5-325 MG tablet Take 1 tablet by mouth 3 (three) times daily as needed. For pain  0  . potassium chloride (K-DUR) 10 MEQ tablet Take 10 mEq by mouth every morning.  2   No current facility-administered medications for this visit.     Past Medical History:  Diagnosis Date  . Alzheimer disease   . Hypertension   . Osteoporosis   . Thyroid disease   . Unsteady gait     Past Surgical History:  Procedure Laterality Date  . ABDOMINAL HYSTERECTOMY    . CHOLECYSTECTOMY    . COLONOSCOPY    . FRACTURE SURGERY      Social History   Social History  . Marital status: Married    Spouse name: N/A    . Number of children: N/A  . Years of education: N/A   Occupational History  . Not on file.   Social History Main Topics  . Smoking status: Never Smoker  . Smokeless tobacco: Never Used  . Alcohol use No  . Drug use: No  . Sexual activity: Not on file   Other Topics Concern  . Not on file   Social History Narrative  . No narrative on file     Vitals:   05/22/16 1301  BP: 122/70  Pulse: 66  SpO2: 98%  Weight: 144 lb (65.3 kg)  Height: 5\' 4"  (1.626 m)    PHYSICAL EXAM General: NAD HEENT: Normal. Neck: No JVD, no thyromegaly. Lungs: Clear to auscultation bilaterally with normal respiratory effort. CV: Nondisplaced PMI.  Regular rate and rhythm, normal S1/S2, no S3/S4, no murmur. No pretibial or periankle edema.  No carotid bruit.   Abdomen: Soft, nontender, no distention.  Neurologic: Alert and oriented.  Psych: Normal affect. Skin: Normal. Musculoskeletal: No gross deformities.    ECG: Most recent ECG reviewed.      ASSESSMENT AND PLAN: 1. Exertional dyspnea with palpitations: Symptoms are new for her. She has no known pulmonary disease. ECG in May 2017 was normal. Echocardiogram demonstrated normal left ventricular systolic function.  Nuclear myocardial perfusion imaging study reviewed above (no ischemia or scar). Consider pulmonary evaluation.  No further cardiac testing indicated at this time.  Dispo: fu prn   Kate Sable, M.D., F.A.C.C.

## 2016-06-03 DIAGNOSIS — G309 Alzheimer's disease, unspecified: Secondary | ICD-10-CM | POA: Diagnosis not present

## 2016-06-03 DIAGNOSIS — M545 Low back pain: Secondary | ICD-10-CM | POA: Diagnosis not present

## 2016-06-03 DIAGNOSIS — F0280 Dementia in other diseases classified elsewhere without behavioral disturbance: Secondary | ICD-10-CM | POA: Diagnosis not present

## 2016-06-03 DIAGNOSIS — Z299 Encounter for prophylactic measures, unspecified: Secondary | ICD-10-CM | POA: Diagnosis not present

## 2016-06-03 DIAGNOSIS — E039 Hypothyroidism, unspecified: Secondary | ICD-10-CM | POA: Diagnosis not present

## 2016-06-03 DIAGNOSIS — Z713 Dietary counseling and surveillance: Secondary | ICD-10-CM | POA: Diagnosis not present

## 2016-06-03 DIAGNOSIS — N183 Chronic kidney disease, stage 3 (moderate): Secondary | ICD-10-CM | POA: Diagnosis not present

## 2016-06-03 DIAGNOSIS — R0602 Shortness of breath: Secondary | ICD-10-CM | POA: Diagnosis not present

## 2016-06-03 DIAGNOSIS — R2681 Unsteadiness on feet: Secondary | ICD-10-CM | POA: Diagnosis not present

## 2016-06-03 DIAGNOSIS — I10 Essential (primary) hypertension: Secondary | ICD-10-CM | POA: Diagnosis not present

## 2016-06-03 DIAGNOSIS — Z6826 Body mass index (BMI) 26.0-26.9, adult: Secondary | ICD-10-CM | POA: Diagnosis not present

## 2016-06-09 IMAGING — DX DG HIP (WITH OR WITHOUT PELVIS) 2-3V*L*
3 series · 3 of 3 positions shown · non-contrast
Comparison: No priors.

CLINICAL DATA: 74-year-old female with history of trauma from
multiple falls today. Left hip pain.

EXAM:
DG HIP (WITH OR WITHOUT PELVIS) 2-3V LEFT

[pelvis ap]
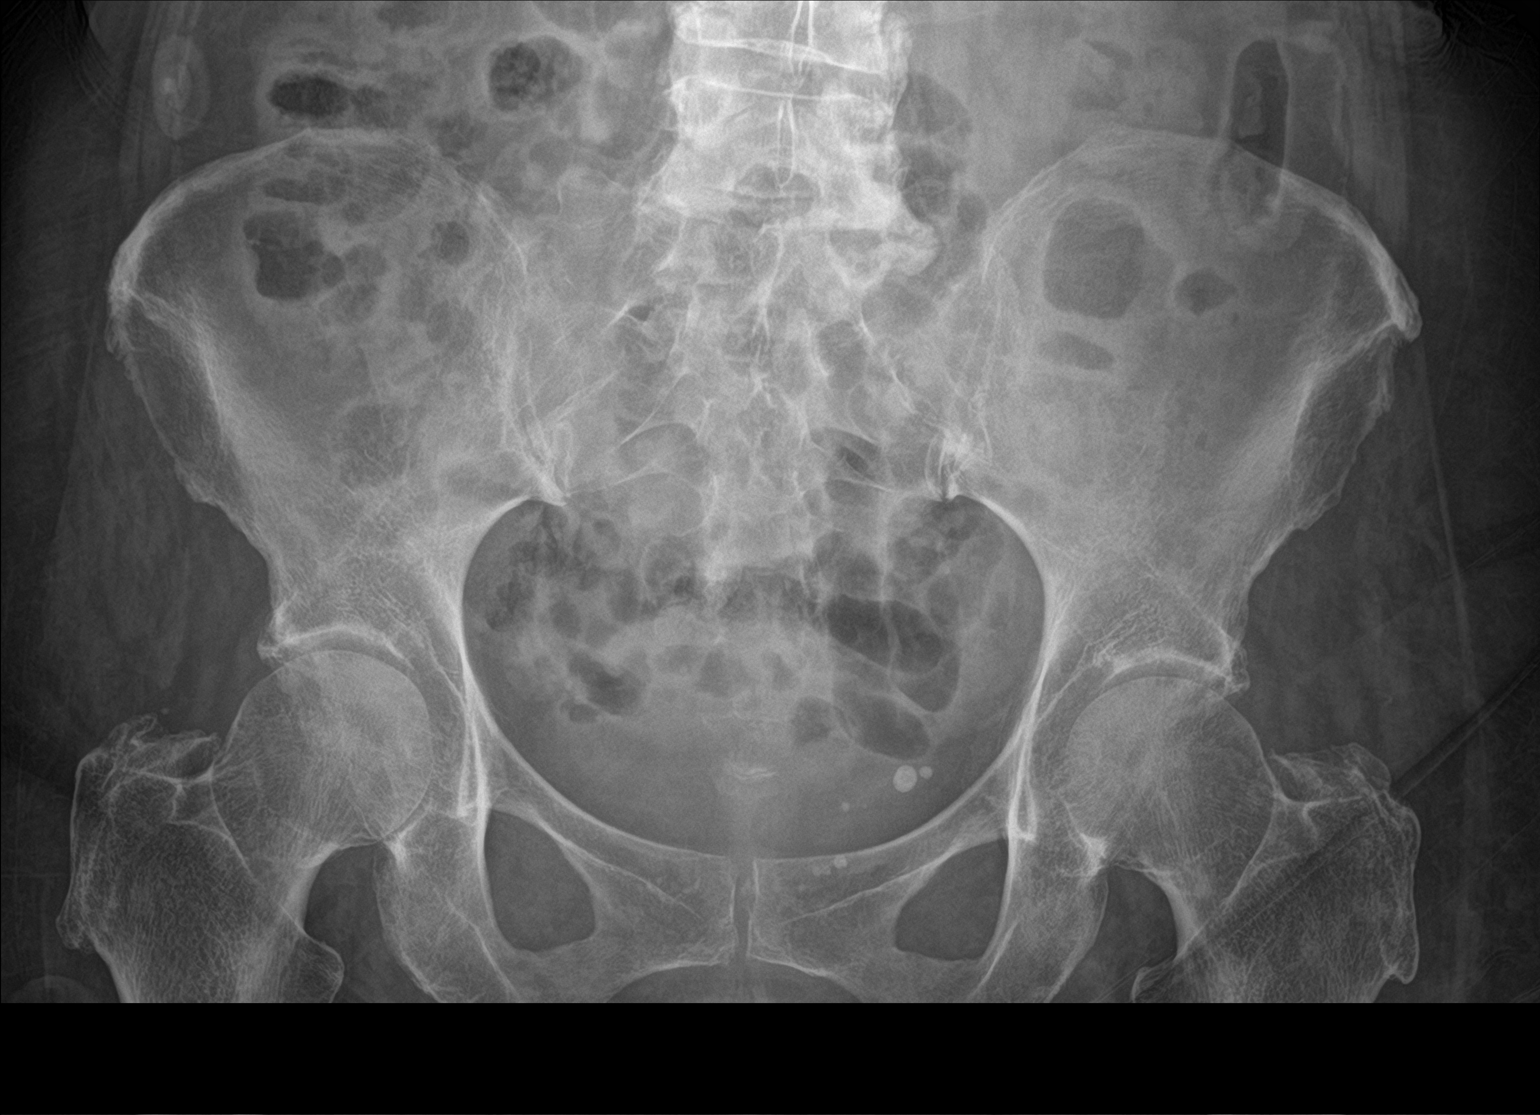

[hip ap]
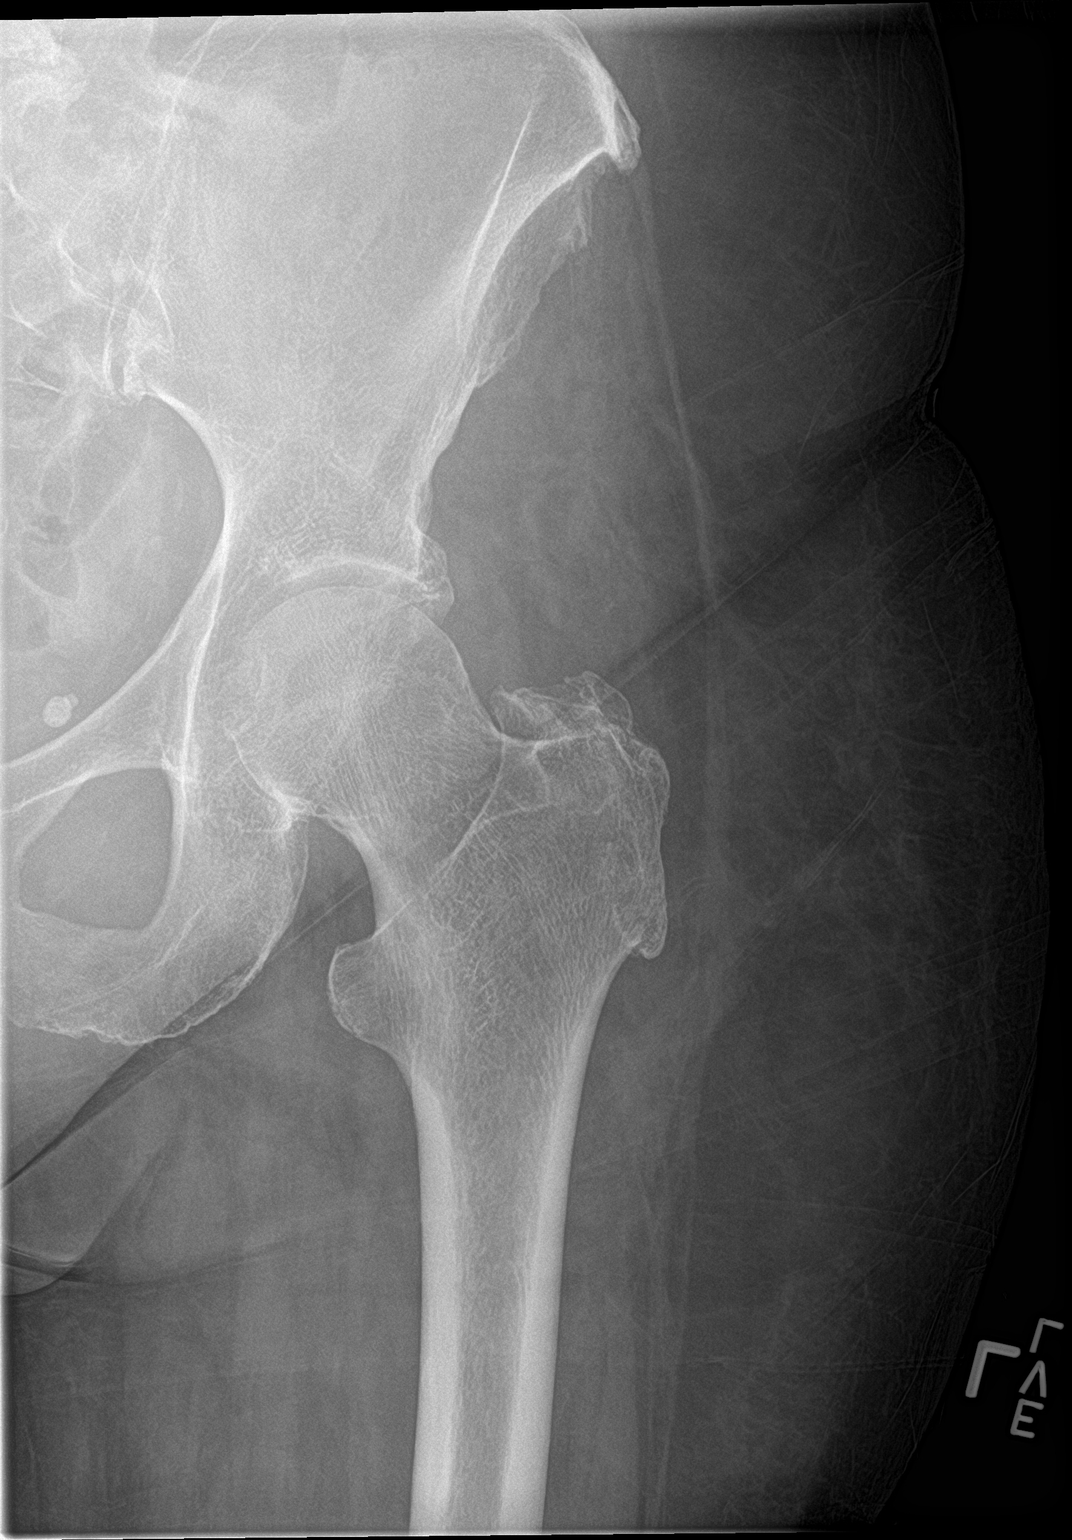

[hip lat]
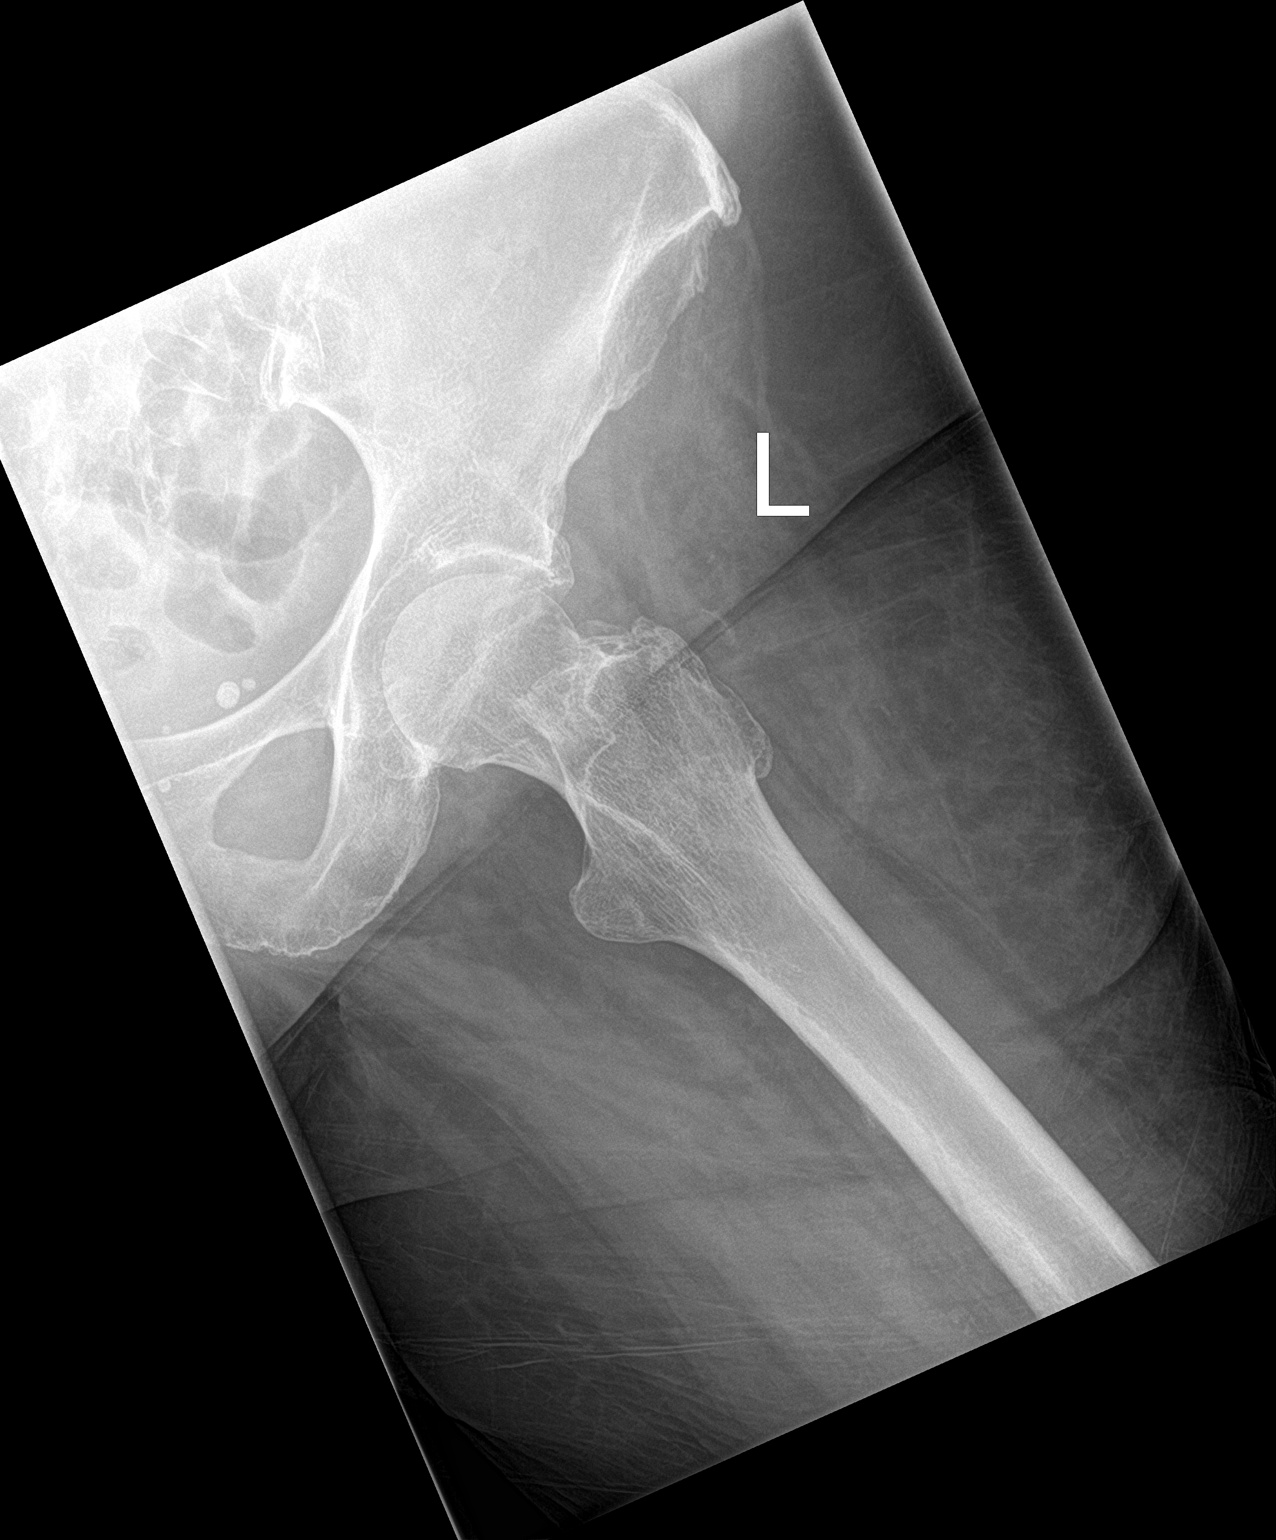

[3 of 3 positions shown; findings below may reference images not displayed]

FINDINGS: AP view of the pelvis and AP and lateral views of the left hip
demonstrate no acute displaced fractures of the bony pelvic ring.
Bilateral proximal femurs as visualized appear intact, and the left
femoral head is properly located. There is mild joint space
narrowing, subchondral sclerosis, subchondral cyst formation and
osteophyte formation in the hip joints bilaterally, compatible with
mild osteoarthritis.
IMPRESSION: 1. No acute radiographic abnormality of the bony pelvis or left hip.
2. Mild bilateral hip joint osteoarthritis.

## 2016-06-10 IMAGING — DX DG KNEE 1-2V*R*
2 series · 3 of 3 positions shown · non-contrast
Comparison: None.

CLINICAL DATA: 74-year-old female with history of trauma from
multiple falls recently. Right knee pain.

EXAM:
RIGHT KNEE - 1-2 VIEW

[knee ap]
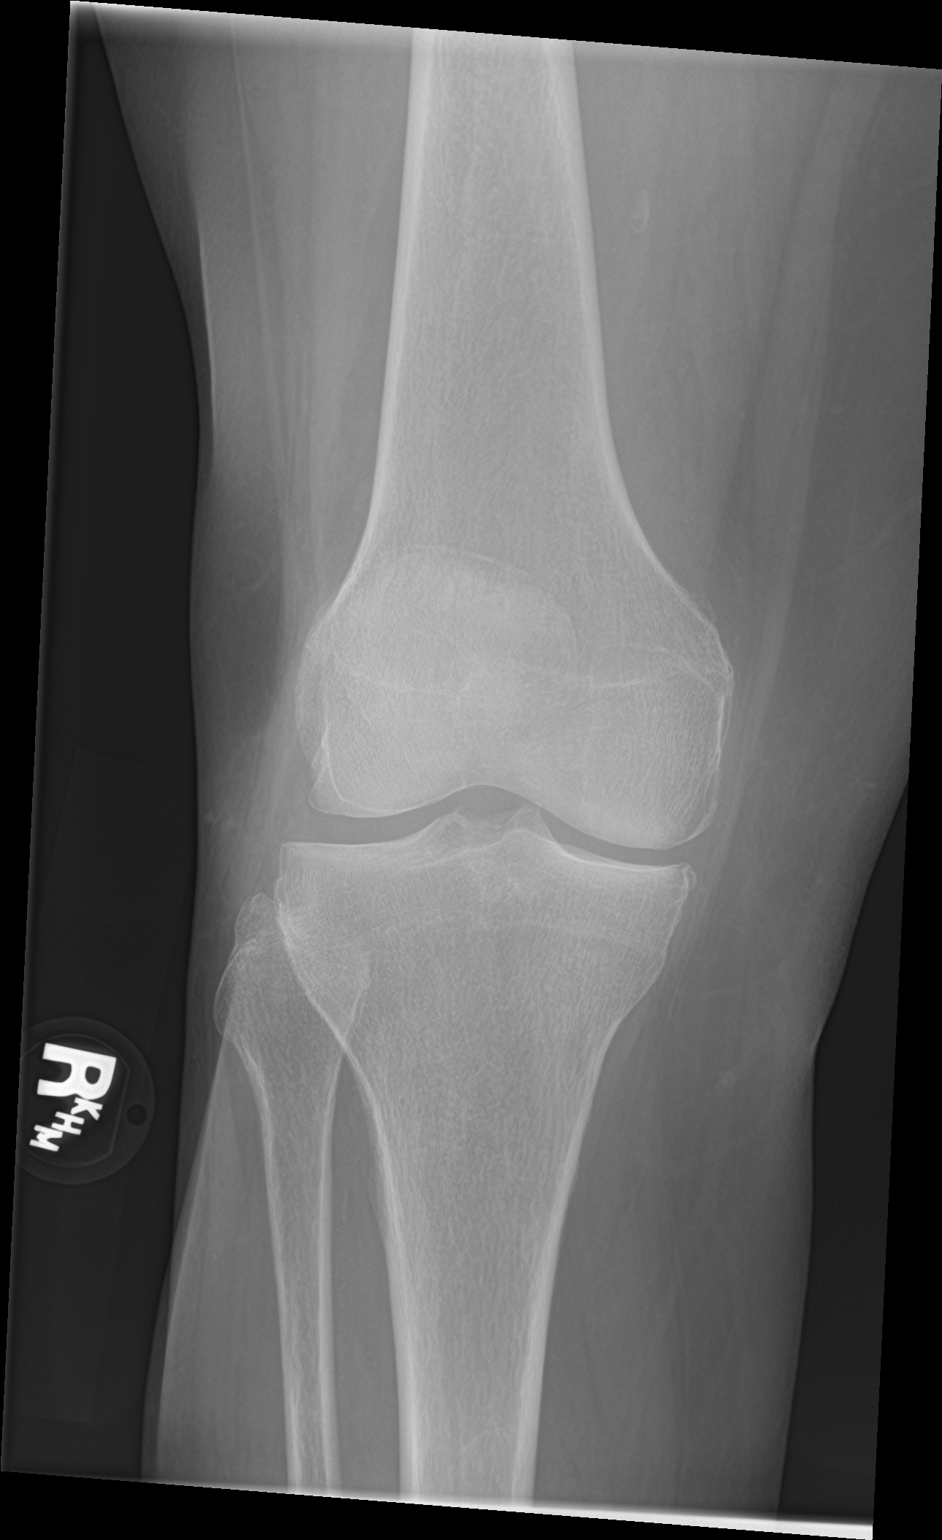

[Series 2: knee lat · 0.14mm/px · 2 of 2 slices shown]
[im 1/2]
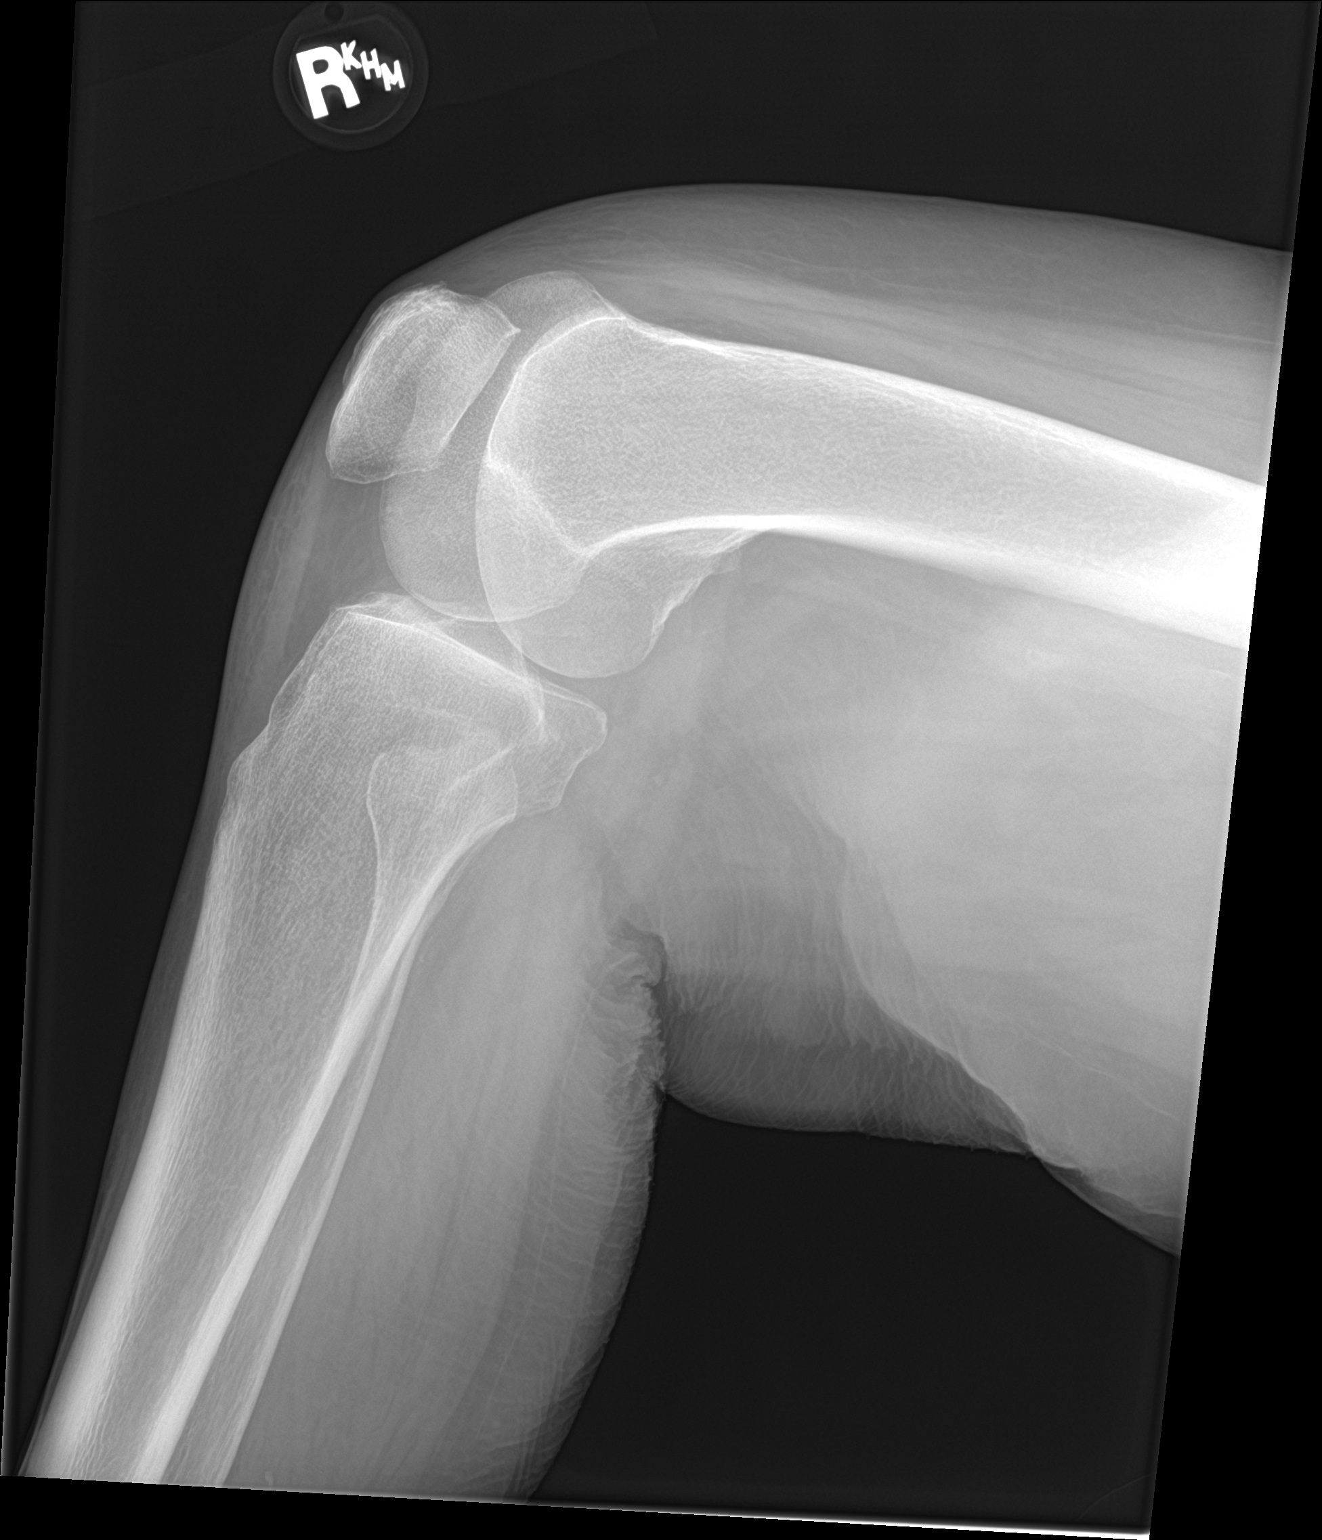
[im 2/2]
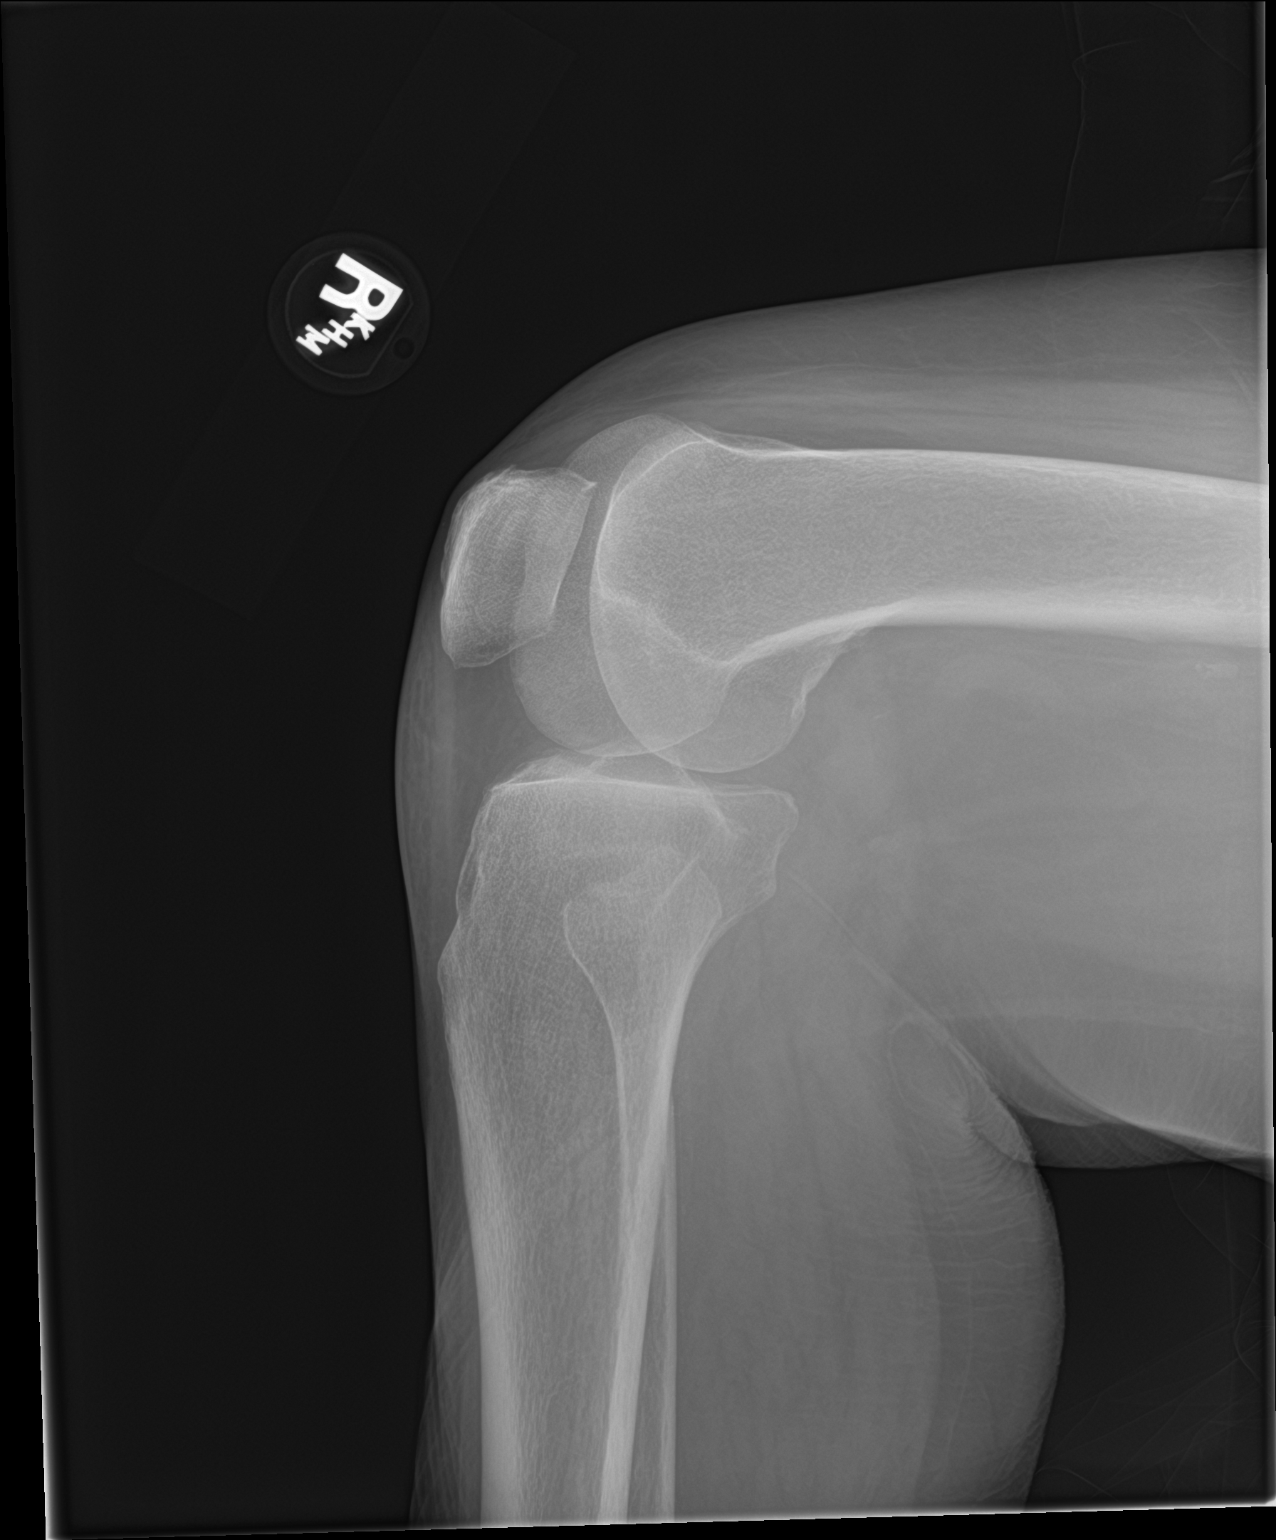

[3 of 3 positions shown; findings below may reference images not displayed]

FINDINGS: There is no evidence of fracture, dislocation, or joint effusion.
There is no evidence of arthropathy or other focal bone abnormality.
Soft tissues are unremarkable.
IMPRESSION: Negative.

## 2016-06-17 DIAGNOSIS — G309 Alzheimer's disease, unspecified: Secondary | ICD-10-CM | POA: Diagnosis not present

## 2016-06-17 DIAGNOSIS — I10 Essential (primary) hypertension: Secondary | ICD-10-CM | POA: Diagnosis not present

## 2016-06-17 DIAGNOSIS — M159 Polyosteoarthritis, unspecified: Secondary | ICD-10-CM | POA: Diagnosis not present

## 2016-06-18 DIAGNOSIS — J069 Acute upper respiratory infection, unspecified: Secondary | ICD-10-CM | POA: Diagnosis not present

## 2016-06-18 DIAGNOSIS — I10 Essential (primary) hypertension: Secondary | ICD-10-CM | POA: Diagnosis not present

## 2016-06-18 DIAGNOSIS — Z789 Other specified health status: Secondary | ICD-10-CM | POA: Diagnosis not present

## 2016-06-18 DIAGNOSIS — Z299 Encounter for prophylactic measures, unspecified: Secondary | ICD-10-CM | POA: Diagnosis not present

## 2016-06-18 DIAGNOSIS — Z713 Dietary counseling and surveillance: Secondary | ICD-10-CM | POA: Diagnosis not present

## 2016-06-18 DIAGNOSIS — E663 Overweight: Secondary | ICD-10-CM | POA: Diagnosis not present

## 2016-06-18 DIAGNOSIS — G309 Alzheimer's disease, unspecified: Secondary | ICD-10-CM | POA: Diagnosis not present

## 2016-06-19 DIAGNOSIS — R0602 Shortness of breath: Secondary | ICD-10-CM | POA: Diagnosis not present

## 2016-06-26 DIAGNOSIS — E663 Overweight: Secondary | ICD-10-CM | POA: Diagnosis not present

## 2016-06-26 DIAGNOSIS — Z79899 Other long term (current) drug therapy: Secondary | ICD-10-CM | POA: Diagnosis not present

## 2016-06-26 DIAGNOSIS — M545 Low back pain: Secondary | ICD-10-CM | POA: Diagnosis not present

## 2016-06-26 DIAGNOSIS — Z713 Dietary counseling and surveillance: Secondary | ICD-10-CM | POA: Diagnosis not present

## 2016-06-26 DIAGNOSIS — J069 Acute upper respiratory infection, unspecified: Secondary | ICD-10-CM | POA: Diagnosis not present

## 2016-06-26 DIAGNOSIS — Z299 Encounter for prophylactic measures, unspecified: Secondary | ICD-10-CM | POA: Diagnosis not present

## 2016-07-20 DIAGNOSIS — I10 Essential (primary) hypertension: Secondary | ICD-10-CM | POA: Diagnosis not present

## 2016-07-20 DIAGNOSIS — G309 Alzheimer's disease, unspecified: Secondary | ICD-10-CM | POA: Diagnosis not present

## 2016-07-20 DIAGNOSIS — M159 Polyosteoarthritis, unspecified: Secondary | ICD-10-CM | POA: Diagnosis not present

## 2016-07-28 DIAGNOSIS — Z6826 Body mass index (BMI) 26.0-26.9, adult: Secondary | ICD-10-CM | POA: Diagnosis not present

## 2016-07-28 DIAGNOSIS — N183 Chronic kidney disease, stage 3 (moderate): Secondary | ICD-10-CM | POA: Diagnosis not present

## 2016-07-28 DIAGNOSIS — J45909 Unspecified asthma, uncomplicated: Secondary | ICD-10-CM | POA: Diagnosis not present

## 2016-07-28 DIAGNOSIS — F0280 Dementia in other diseases classified elsewhere without behavioral disturbance: Secondary | ICD-10-CM | POA: Diagnosis not present

## 2016-07-28 DIAGNOSIS — I10 Essential (primary) hypertension: Secondary | ICD-10-CM | POA: Diagnosis not present

## 2016-07-28 DIAGNOSIS — Z79899 Other long term (current) drug therapy: Secondary | ICD-10-CM | POA: Diagnosis not present

## 2016-07-28 DIAGNOSIS — M810 Age-related osteoporosis without current pathological fracture: Secondary | ICD-10-CM | POA: Diagnosis not present

## 2016-07-28 DIAGNOSIS — F419 Anxiety disorder, unspecified: Secondary | ICD-10-CM | POA: Diagnosis not present

## 2016-07-28 DIAGNOSIS — G8929 Other chronic pain: Secondary | ICD-10-CM | POA: Diagnosis not present

## 2016-07-28 DIAGNOSIS — E039 Hypothyroidism, unspecified: Secondary | ICD-10-CM | POA: Diagnosis not present

## 2016-07-28 DIAGNOSIS — G309 Alzheimer's disease, unspecified: Secondary | ICD-10-CM | POA: Diagnosis not present

## 2016-07-28 DIAGNOSIS — Z299 Encounter for prophylactic measures, unspecified: Secondary | ICD-10-CM | POA: Diagnosis not present

## 2016-08-11 DIAGNOSIS — M6281 Muscle weakness (generalized): Secondary | ICD-10-CM | POA: Diagnosis not present

## 2016-08-11 DIAGNOSIS — R269 Unspecified abnormalities of gait and mobility: Secondary | ICD-10-CM | POA: Diagnosis not present

## 2016-08-13 DIAGNOSIS — M6281 Muscle weakness (generalized): Secondary | ICD-10-CM | POA: Diagnosis not present

## 2016-08-13 DIAGNOSIS — R269 Unspecified abnormalities of gait and mobility: Secondary | ICD-10-CM | POA: Diagnosis not present

## 2016-08-17 DIAGNOSIS — R269 Unspecified abnormalities of gait and mobility: Secondary | ICD-10-CM | POA: Diagnosis not present

## 2016-08-17 DIAGNOSIS — M6281 Muscle weakness (generalized): Secondary | ICD-10-CM | POA: Diagnosis not present

## 2016-08-19 DIAGNOSIS — I10 Essential (primary) hypertension: Secondary | ICD-10-CM | POA: Diagnosis not present

## 2016-08-19 DIAGNOSIS — G309 Alzheimer's disease, unspecified: Secondary | ICD-10-CM | POA: Diagnosis not present

## 2016-08-19 DIAGNOSIS — M159 Polyosteoarthritis, unspecified: Secondary | ICD-10-CM | POA: Diagnosis not present

## 2016-08-20 DIAGNOSIS — M6281 Muscle weakness (generalized): Secondary | ICD-10-CM | POA: Diagnosis not present

## 2016-08-20 DIAGNOSIS — R269 Unspecified abnormalities of gait and mobility: Secondary | ICD-10-CM | POA: Diagnosis not present

## 2016-08-27 DIAGNOSIS — Z7982 Long term (current) use of aspirin: Secondary | ICD-10-CM | POA: Diagnosis not present

## 2016-08-27 DIAGNOSIS — K529 Noninfective gastroenteritis and colitis, unspecified: Secondary | ICD-10-CM | POA: Diagnosis not present

## 2016-08-27 DIAGNOSIS — R2681 Unsteadiness on feet: Secondary | ICD-10-CM | POA: Diagnosis not present

## 2016-08-27 DIAGNOSIS — Z801 Family history of malignant neoplasm of trachea, bronchus and lung: Secondary | ICD-10-CM | POA: Diagnosis not present

## 2016-08-27 DIAGNOSIS — G309 Alzheimer's disease, unspecified: Secondary | ICD-10-CM | POA: Diagnosis not present

## 2016-08-27 DIAGNOSIS — E039 Hypothyroidism, unspecified: Secondary | ICD-10-CM | POA: Diagnosis not present

## 2016-08-27 DIAGNOSIS — Z9049 Acquired absence of other specified parts of digestive tract: Secondary | ICD-10-CM | POA: Diagnosis not present

## 2016-08-27 DIAGNOSIS — N390 Urinary tract infection, site not specified: Secondary | ICD-10-CM | POA: Diagnosis not present

## 2016-08-27 DIAGNOSIS — I10 Essential (primary) hypertension: Secondary | ICD-10-CM | POA: Diagnosis not present

## 2016-08-27 DIAGNOSIS — M810 Age-related osteoporosis without current pathological fracture: Secondary | ICD-10-CM | POA: Diagnosis not present

## 2016-08-27 DIAGNOSIS — Z90710 Acquired absence of both cervix and uterus: Secondary | ICD-10-CM | POA: Diagnosis not present

## 2016-08-27 DIAGNOSIS — F0280 Dementia in other diseases classified elsewhere without behavioral disturbance: Secondary | ICD-10-CM | POA: Diagnosis not present

## 2016-08-27 DIAGNOSIS — Z79899 Other long term (current) drug therapy: Secondary | ICD-10-CM | POA: Diagnosis not present

## 2016-08-28 DIAGNOSIS — K529 Noninfective gastroenteritis and colitis, unspecified: Secondary | ICD-10-CM | POA: Diagnosis not present

## 2016-08-28 DIAGNOSIS — I10 Essential (primary) hypertension: Secondary | ICD-10-CM | POA: Diagnosis not present

## 2016-08-28 DIAGNOSIS — N390 Urinary tract infection, site not specified: Secondary | ICD-10-CM | POA: Diagnosis not present

## 2016-08-28 DIAGNOSIS — Z7982 Long term (current) use of aspirin: Secondary | ICD-10-CM | POA: Diagnosis not present

## 2016-08-28 DIAGNOSIS — G309 Alzheimer's disease, unspecified: Secondary | ICD-10-CM | POA: Diagnosis not present

## 2016-08-28 DIAGNOSIS — Z79899 Other long term (current) drug therapy: Secondary | ICD-10-CM | POA: Diagnosis not present

## 2016-09-04 DIAGNOSIS — G309 Alzheimer's disease, unspecified: Secondary | ICD-10-CM | POA: Diagnosis not present

## 2016-09-04 DIAGNOSIS — E039 Hypothyroidism, unspecified: Secondary | ICD-10-CM | POA: Diagnosis not present

## 2016-09-04 DIAGNOSIS — I10 Essential (primary) hypertension: Secondary | ICD-10-CM | POA: Diagnosis not present

## 2016-09-04 DIAGNOSIS — F0280 Dementia in other diseases classified elsewhere without behavioral disturbance: Secondary | ICD-10-CM | POA: Diagnosis not present

## 2016-09-04 DIAGNOSIS — N183 Chronic kidney disease, stage 3 (moderate): Secondary | ICD-10-CM | POA: Diagnosis not present

## 2016-09-04 DIAGNOSIS — Z6824 Body mass index (BMI) 24.0-24.9, adult: Secondary | ICD-10-CM | POA: Diagnosis not present

## 2016-09-04 DIAGNOSIS — F419 Anxiety disorder, unspecified: Secondary | ICD-10-CM | POA: Diagnosis not present

## 2016-09-04 DIAGNOSIS — Z299 Encounter for prophylactic measures, unspecified: Secondary | ICD-10-CM | POA: Diagnosis not present

## 2016-09-04 DIAGNOSIS — G8929 Other chronic pain: Secondary | ICD-10-CM | POA: Diagnosis not present

## 2016-09-04 DIAGNOSIS — R2681 Unsteadiness on feet: Secondary | ICD-10-CM | POA: Diagnosis not present

## 2016-09-07 DIAGNOSIS — G309 Alzheimer's disease, unspecified: Secondary | ICD-10-CM | POA: Diagnosis not present

## 2016-09-07 DIAGNOSIS — I10 Essential (primary) hypertension: Secondary | ICD-10-CM | POA: Diagnosis not present

## 2016-09-07 DIAGNOSIS — M6281 Muscle weakness (generalized): Secondary | ICD-10-CM | POA: Diagnosis not present

## 2016-09-07 DIAGNOSIS — M159 Polyosteoarthritis, unspecified: Secondary | ICD-10-CM | POA: Diagnosis not present

## 2016-09-07 DIAGNOSIS — R269 Unspecified abnormalities of gait and mobility: Secondary | ICD-10-CM | POA: Diagnosis not present

## 2016-09-10 DIAGNOSIS — M6281 Muscle weakness (generalized): Secondary | ICD-10-CM | POA: Diagnosis not present

## 2016-09-10 DIAGNOSIS — R269 Unspecified abnormalities of gait and mobility: Secondary | ICD-10-CM | POA: Diagnosis not present

## 2016-09-14 DIAGNOSIS — R269 Unspecified abnormalities of gait and mobility: Secondary | ICD-10-CM | POA: Diagnosis not present

## 2016-09-14 DIAGNOSIS — M6281 Muscle weakness (generalized): Secondary | ICD-10-CM | POA: Diagnosis not present

## 2016-09-18 DIAGNOSIS — M6281 Muscle weakness (generalized): Secondary | ICD-10-CM | POA: Diagnosis not present

## 2016-09-18 DIAGNOSIS — R269 Unspecified abnormalities of gait and mobility: Secondary | ICD-10-CM | POA: Diagnosis not present

## 2016-10-08 DIAGNOSIS — Z1389 Encounter for screening for other disorder: Secondary | ICD-10-CM | POA: Diagnosis not present

## 2016-10-08 DIAGNOSIS — N183 Chronic kidney disease, stage 3 (moderate): Secondary | ICD-10-CM | POA: Diagnosis not present

## 2016-10-08 DIAGNOSIS — E559 Vitamin D deficiency, unspecified: Secondary | ICD-10-CM | POA: Diagnosis not present

## 2016-10-08 DIAGNOSIS — Z1211 Encounter for screening for malignant neoplasm of colon: Secondary | ICD-10-CM | POA: Diagnosis not present

## 2016-10-08 DIAGNOSIS — Z299 Encounter for prophylactic measures, unspecified: Secondary | ICD-10-CM | POA: Diagnosis not present

## 2016-10-08 DIAGNOSIS — Z7189 Other specified counseling: Secondary | ICD-10-CM | POA: Diagnosis not present

## 2016-10-08 DIAGNOSIS — Z79899 Other long term (current) drug therapy: Secondary | ICD-10-CM | POA: Diagnosis not present

## 2016-10-08 DIAGNOSIS — F329 Major depressive disorder, single episode, unspecified: Secondary | ICD-10-CM | POA: Diagnosis not present

## 2016-10-08 DIAGNOSIS — Z0000 Encounter for general adult medical examination without abnormal findings: Secondary | ICD-10-CM | POA: Diagnosis not present

## 2016-10-08 DIAGNOSIS — Z6823 Body mass index (BMI) 23.0-23.9, adult: Secondary | ICD-10-CM | POA: Diagnosis not present

## 2016-10-08 DIAGNOSIS — R5383 Other fatigue: Secondary | ICD-10-CM | POA: Diagnosis not present

## 2016-10-08 DIAGNOSIS — F419 Anxiety disorder, unspecified: Secondary | ICD-10-CM | POA: Diagnosis not present

## 2016-10-08 DIAGNOSIS — I10 Essential (primary) hypertension: Secondary | ICD-10-CM | POA: Diagnosis not present

## 2016-10-08 DIAGNOSIS — E039 Hypothyroidism, unspecified: Secondary | ICD-10-CM | POA: Diagnosis not present

## 2016-10-12 DIAGNOSIS — M6281 Muscle weakness (generalized): Secondary | ICD-10-CM | POA: Diagnosis not present

## 2016-10-12 DIAGNOSIS — R269 Unspecified abnormalities of gait and mobility: Secondary | ICD-10-CM | POA: Diagnosis not present

## 2016-10-15 DIAGNOSIS — M6281 Muscle weakness (generalized): Secondary | ICD-10-CM | POA: Diagnosis not present

## 2016-10-15 DIAGNOSIS — R269 Unspecified abnormalities of gait and mobility: Secondary | ICD-10-CM | POA: Diagnosis not present

## 2016-10-19 DIAGNOSIS — R269 Unspecified abnormalities of gait and mobility: Secondary | ICD-10-CM | POA: Diagnosis not present

## 2016-10-19 DIAGNOSIS — M6281 Muscle weakness (generalized): Secondary | ICD-10-CM | POA: Diagnosis not present

## 2016-10-22 DIAGNOSIS — R269 Unspecified abnormalities of gait and mobility: Secondary | ICD-10-CM | POA: Diagnosis not present

## 2016-10-22 DIAGNOSIS — M159 Polyosteoarthritis, unspecified: Secondary | ICD-10-CM | POA: Diagnosis not present

## 2016-10-22 DIAGNOSIS — G309 Alzheimer's disease, unspecified: Secondary | ICD-10-CM | POA: Diagnosis not present

## 2016-10-22 DIAGNOSIS — I10 Essential (primary) hypertension: Secondary | ICD-10-CM | POA: Diagnosis not present

## 2016-10-22 DIAGNOSIS — M6281 Muscle weakness (generalized): Secondary | ICD-10-CM | POA: Diagnosis not present

## 2016-10-26 DIAGNOSIS — M6281 Muscle weakness (generalized): Secondary | ICD-10-CM | POA: Diagnosis not present

## 2016-10-26 DIAGNOSIS — R269 Unspecified abnormalities of gait and mobility: Secondary | ICD-10-CM | POA: Diagnosis not present

## 2016-10-29 DIAGNOSIS — M6281 Muscle weakness (generalized): Secondary | ICD-10-CM | POA: Diagnosis not present

## 2016-10-29 DIAGNOSIS — R269 Unspecified abnormalities of gait and mobility: Secondary | ICD-10-CM | POA: Diagnosis not present

## 2016-11-02 DIAGNOSIS — M6281 Muscle weakness (generalized): Secondary | ICD-10-CM | POA: Diagnosis not present

## 2016-11-02 DIAGNOSIS — R269 Unspecified abnormalities of gait and mobility: Secondary | ICD-10-CM | POA: Diagnosis not present

## 2016-11-06 DIAGNOSIS — R269 Unspecified abnormalities of gait and mobility: Secondary | ICD-10-CM | POA: Diagnosis not present

## 2016-11-06 DIAGNOSIS — M6281 Muscle weakness (generalized): Secondary | ICD-10-CM | POA: Diagnosis not present

## 2016-11-09 DIAGNOSIS — M6281 Muscle weakness (generalized): Secondary | ICD-10-CM | POA: Diagnosis not present

## 2016-11-09 DIAGNOSIS — R269 Unspecified abnormalities of gait and mobility: Secondary | ICD-10-CM | POA: Diagnosis not present

## 2016-11-12 DIAGNOSIS — M6281 Muscle weakness (generalized): Secondary | ICD-10-CM | POA: Diagnosis not present

## 2016-11-12 DIAGNOSIS — R269 Unspecified abnormalities of gait and mobility: Secondary | ICD-10-CM | POA: Diagnosis not present

## 2016-11-16 DIAGNOSIS — M6281 Muscle weakness (generalized): Secondary | ICD-10-CM | POA: Diagnosis not present

## 2016-11-16 DIAGNOSIS — R269 Unspecified abnormalities of gait and mobility: Secondary | ICD-10-CM | POA: Diagnosis not present

## 2016-11-17 DIAGNOSIS — M159 Polyosteoarthritis, unspecified: Secondary | ICD-10-CM | POA: Diagnosis not present

## 2016-11-17 DIAGNOSIS — I10 Essential (primary) hypertension: Secondary | ICD-10-CM | POA: Diagnosis not present

## 2016-11-17 DIAGNOSIS — G309 Alzheimer's disease, unspecified: Secondary | ICD-10-CM | POA: Diagnosis not present

## 2016-11-19 DIAGNOSIS — R269 Unspecified abnormalities of gait and mobility: Secondary | ICD-10-CM | POA: Diagnosis not present

## 2016-11-19 DIAGNOSIS — M6281 Muscle weakness (generalized): Secondary | ICD-10-CM | POA: Diagnosis not present

## 2016-12-03 DIAGNOSIS — M25511 Pain in right shoulder: Secondary | ICD-10-CM | POA: Diagnosis not present

## 2017-01-04 DIAGNOSIS — M25511 Pain in right shoulder: Secondary | ICD-10-CM | POA: Diagnosis not present

## 2017-01-04 DIAGNOSIS — M75122 Complete rotator cuff tear or rupture of left shoulder, not specified as traumatic: Secondary | ICD-10-CM | POA: Diagnosis not present

## 2017-01-04 DIAGNOSIS — M75121 Complete rotator cuff tear or rupture of right shoulder, not specified as traumatic: Secondary | ICD-10-CM | POA: Diagnosis not present

## 2017-01-04 DIAGNOSIS — Z6822 Body mass index (BMI) 22.0-22.9, adult: Secondary | ICD-10-CM | POA: Diagnosis not present

## 2017-01-07 DIAGNOSIS — S46011A Strain of muscle(s) and tendon(s) of the rotator cuff of right shoulder, initial encounter: Secondary | ICD-10-CM | POA: Diagnosis not present

## 2017-01-07 DIAGNOSIS — M719 Bursopathy, unspecified: Secondary | ICD-10-CM | POA: Diagnosis not present

## 2017-01-07 DIAGNOSIS — M62511 Muscle wasting and atrophy, not elsewhere classified, right shoulder: Secondary | ICD-10-CM | POA: Diagnosis not present

## 2017-01-07 DIAGNOSIS — S46012A Strain of muscle(s) and tendon(s) of the rotator cuff of left shoulder, initial encounter: Secondary | ICD-10-CM | POA: Diagnosis not present

## 2017-01-07 DIAGNOSIS — M19011 Primary osteoarthritis, right shoulder: Secondary | ICD-10-CM | POA: Diagnosis not present

## 2017-01-07 DIAGNOSIS — M75121 Complete rotator cuff tear or rupture of right shoulder, not specified as traumatic: Secondary | ICD-10-CM | POA: Diagnosis not present

## 2017-01-07 DIAGNOSIS — M62512 Muscle wasting and atrophy, not elsewhere classified, left shoulder: Secondary | ICD-10-CM | POA: Diagnosis not present

## 2017-01-07 DIAGNOSIS — M75122 Complete rotator cuff tear or rupture of left shoulder, not specified as traumatic: Secondary | ICD-10-CM | POA: Diagnosis not present

## 2017-01-26 DIAGNOSIS — Z6822 Body mass index (BMI) 22.0-22.9, adult: Secondary | ICD-10-CM | POA: Diagnosis not present

## 2017-01-26 DIAGNOSIS — M25511 Pain in right shoulder: Secondary | ICD-10-CM | POA: Diagnosis not present

## 2017-01-26 DIAGNOSIS — M75121 Complete rotator cuff tear or rupture of right shoulder, not specified as traumatic: Secondary | ICD-10-CM | POA: Diagnosis not present

## 2017-01-26 DIAGNOSIS — M75122 Complete rotator cuff tear or rupture of left shoulder, not specified as traumatic: Secondary | ICD-10-CM | POA: Diagnosis not present

## 2017-02-03 DIAGNOSIS — M25512 Pain in left shoulder: Secondary | ICD-10-CM | POA: Diagnosis not present

## 2017-02-03 DIAGNOSIS — M25511 Pain in right shoulder: Secondary | ICD-10-CM | POA: Diagnosis not present

## 2017-02-05 DIAGNOSIS — M25512 Pain in left shoulder: Secondary | ICD-10-CM | POA: Diagnosis not present

## 2017-02-05 DIAGNOSIS — M25511 Pain in right shoulder: Secondary | ICD-10-CM | POA: Diagnosis not present

## 2017-02-08 DIAGNOSIS — M25511 Pain in right shoulder: Secondary | ICD-10-CM | POA: Diagnosis not present

## 2017-02-08 DIAGNOSIS — M25512 Pain in left shoulder: Secondary | ICD-10-CM | POA: Diagnosis not present

## 2017-02-11 DIAGNOSIS — M25512 Pain in left shoulder: Secondary | ICD-10-CM | POA: Diagnosis not present

## 2017-02-11 DIAGNOSIS — M25511 Pain in right shoulder: Secondary | ICD-10-CM | POA: Diagnosis not present

## 2017-02-22 DIAGNOSIS — M25512 Pain in left shoulder: Secondary | ICD-10-CM | POA: Diagnosis not present

## 2017-02-22 DIAGNOSIS — M25511 Pain in right shoulder: Secondary | ICD-10-CM | POA: Diagnosis not present

## 2017-02-24 DIAGNOSIS — M25511 Pain in right shoulder: Secondary | ICD-10-CM | POA: Diagnosis not present

## 2017-02-24 DIAGNOSIS — M25512 Pain in left shoulder: Secondary | ICD-10-CM | POA: Diagnosis not present

## 2017-03-01 DIAGNOSIS — M25511 Pain in right shoulder: Secondary | ICD-10-CM | POA: Diagnosis not present

## 2017-03-01 DIAGNOSIS — M25512 Pain in left shoulder: Secondary | ICD-10-CM | POA: Diagnosis not present

## 2017-03-02 DIAGNOSIS — Z23 Encounter for immunization: Secondary | ICD-10-CM | POA: Diagnosis not present

## 2017-03-04 DIAGNOSIS — M25512 Pain in left shoulder: Secondary | ICD-10-CM | POA: Diagnosis not present

## 2017-03-04 DIAGNOSIS — M25511 Pain in right shoulder: Secondary | ICD-10-CM | POA: Diagnosis not present

## 2017-03-08 DIAGNOSIS — M25512 Pain in left shoulder: Secondary | ICD-10-CM | POA: Diagnosis not present

## 2017-03-08 DIAGNOSIS — M25511 Pain in right shoulder: Secondary | ICD-10-CM | POA: Diagnosis not present

## 2017-03-10 DIAGNOSIS — G309 Alzheimer's disease, unspecified: Secondary | ICD-10-CM | POA: Diagnosis not present

## 2017-03-10 DIAGNOSIS — I10 Essential (primary) hypertension: Secondary | ICD-10-CM | POA: Diagnosis not present

## 2017-03-10 DIAGNOSIS — M159 Polyosteoarthritis, unspecified: Secondary | ICD-10-CM | POA: Diagnosis not present

## 2017-03-11 DIAGNOSIS — F329 Major depressive disorder, single episode, unspecified: Secondary | ICD-10-CM | POA: Diagnosis not present

## 2017-03-11 DIAGNOSIS — F0280 Dementia in other diseases classified elsewhere without behavioral disturbance: Secondary | ICD-10-CM | POA: Diagnosis not present

## 2017-03-11 DIAGNOSIS — M25512 Pain in left shoulder: Secondary | ICD-10-CM | POA: Diagnosis not present

## 2017-03-11 DIAGNOSIS — Z299 Encounter for prophylactic measures, unspecified: Secondary | ICD-10-CM | POA: Diagnosis not present

## 2017-03-11 DIAGNOSIS — G309 Alzheimer's disease, unspecified: Secondary | ICD-10-CM | POA: Diagnosis not present

## 2017-03-11 DIAGNOSIS — M25511 Pain in right shoulder: Secondary | ICD-10-CM | POA: Diagnosis not present

## 2017-03-11 DIAGNOSIS — N183 Chronic kidney disease, stage 3 (moderate): Secondary | ICD-10-CM | POA: Diagnosis not present

## 2017-03-11 DIAGNOSIS — I10 Essential (primary) hypertension: Secondary | ICD-10-CM | POA: Diagnosis not present

## 2017-03-11 DIAGNOSIS — Z6825 Body mass index (BMI) 25.0-25.9, adult: Secondary | ICD-10-CM | POA: Diagnosis not present

## 2017-03-11 DIAGNOSIS — G8929 Other chronic pain: Secondary | ICD-10-CM | POA: Diagnosis not present

## 2017-03-11 DIAGNOSIS — E039 Hypothyroidism, unspecified: Secondary | ICD-10-CM | POA: Diagnosis not present

## 2017-03-17 DIAGNOSIS — M25511 Pain in right shoulder: Secondary | ICD-10-CM | POA: Diagnosis not present

## 2017-03-17 DIAGNOSIS — M25512 Pain in left shoulder: Secondary | ICD-10-CM | POA: Diagnosis not present

## 2017-03-18 DIAGNOSIS — M25512 Pain in left shoulder: Secondary | ICD-10-CM | POA: Diagnosis not present

## 2017-03-18 DIAGNOSIS — M25511 Pain in right shoulder: Secondary | ICD-10-CM | POA: Diagnosis not present

## 2017-03-23 DIAGNOSIS — M25511 Pain in right shoulder: Secondary | ICD-10-CM | POA: Diagnosis not present

## 2017-03-23 DIAGNOSIS — M25512 Pain in left shoulder: Secondary | ICD-10-CM | POA: Diagnosis not present

## 2017-03-25 DIAGNOSIS — M25512 Pain in left shoulder: Secondary | ICD-10-CM | POA: Diagnosis not present

## 2017-03-25 DIAGNOSIS — M25511 Pain in right shoulder: Secondary | ICD-10-CM | POA: Diagnosis not present

## 2017-04-01 DIAGNOSIS — M25511 Pain in right shoulder: Secondary | ICD-10-CM | POA: Diagnosis not present

## 2017-04-01 DIAGNOSIS — M25512 Pain in left shoulder: Secondary | ICD-10-CM | POA: Diagnosis not present

## 2017-04-02 DIAGNOSIS — M25512 Pain in left shoulder: Secondary | ICD-10-CM | POA: Diagnosis not present

## 2017-04-02 DIAGNOSIS — M25511 Pain in right shoulder: Secondary | ICD-10-CM | POA: Diagnosis not present

## 2017-04-08 DIAGNOSIS — M25511 Pain in right shoulder: Secondary | ICD-10-CM | POA: Diagnosis not present

## 2017-04-08 DIAGNOSIS — M25512 Pain in left shoulder: Secondary | ICD-10-CM | POA: Diagnosis not present

## 2017-04-09 DIAGNOSIS — I10 Essential (primary) hypertension: Secondary | ICD-10-CM | POA: Diagnosis not present

## 2017-04-09 DIAGNOSIS — M25512 Pain in left shoulder: Secondary | ICD-10-CM | POA: Diagnosis not present

## 2017-04-09 DIAGNOSIS — G309 Alzheimer's disease, unspecified: Secondary | ICD-10-CM | POA: Diagnosis not present

## 2017-04-09 DIAGNOSIS — M159 Polyosteoarthritis, unspecified: Secondary | ICD-10-CM | POA: Diagnosis not present

## 2017-04-09 DIAGNOSIS — M25511 Pain in right shoulder: Secondary | ICD-10-CM | POA: Diagnosis not present

## 2017-04-13 DIAGNOSIS — I10 Essential (primary) hypertension: Secondary | ICD-10-CM | POA: Diagnosis not present

## 2017-04-13 DIAGNOSIS — G8929 Other chronic pain: Secondary | ICD-10-CM | POA: Diagnosis not present

## 2017-04-13 DIAGNOSIS — Z6825 Body mass index (BMI) 25.0-25.9, adult: Secondary | ICD-10-CM | POA: Diagnosis not present

## 2017-04-13 DIAGNOSIS — G309 Alzheimer's disease, unspecified: Secondary | ICD-10-CM | POA: Diagnosis not present

## 2017-04-13 DIAGNOSIS — Z299 Encounter for prophylactic measures, unspecified: Secondary | ICD-10-CM | POA: Diagnosis not present

## 2017-04-13 DIAGNOSIS — F0280 Dementia in other diseases classified elsewhere without behavioral disturbance: Secondary | ICD-10-CM | POA: Diagnosis not present

## 2017-04-13 DIAGNOSIS — Z789 Other specified health status: Secondary | ICD-10-CM | POA: Diagnosis not present

## 2017-04-13 DIAGNOSIS — M25512 Pain in left shoulder: Secondary | ICD-10-CM | POA: Diagnosis not present

## 2017-04-13 DIAGNOSIS — M25511 Pain in right shoulder: Secondary | ICD-10-CM | POA: Diagnosis not present

## 2017-04-13 DIAGNOSIS — Z713 Dietary counseling and surveillance: Secondary | ICD-10-CM | POA: Diagnosis not present

## 2017-04-13 DIAGNOSIS — E039 Hypothyroidism, unspecified: Secondary | ICD-10-CM | POA: Diagnosis not present

## 2017-04-14 DIAGNOSIS — M75121 Complete rotator cuff tear or rupture of right shoulder, not specified as traumatic: Secondary | ICD-10-CM | POA: Diagnosis not present

## 2017-04-14 DIAGNOSIS — M75122 Complete rotator cuff tear or rupture of left shoulder, not specified as traumatic: Secondary | ICD-10-CM | POA: Diagnosis not present

## 2017-04-14 DIAGNOSIS — Z6822 Body mass index (BMI) 22.0-22.9, adult: Secondary | ICD-10-CM | POA: Diagnosis not present

## 2017-04-14 DIAGNOSIS — M25512 Pain in left shoulder: Secondary | ICD-10-CM | POA: Diagnosis not present

## 2017-04-14 DIAGNOSIS — M12812 Other specific arthropathies, not elsewhere classified, left shoulder: Secondary | ICD-10-CM | POA: Diagnosis not present

## 2017-04-14 DIAGNOSIS — M12811 Other specific arthropathies, not elsewhere classified, right shoulder: Secondary | ICD-10-CM | POA: Diagnosis not present

## 2017-04-15 DIAGNOSIS — M25512 Pain in left shoulder: Secondary | ICD-10-CM | POA: Diagnosis not present

## 2017-04-15 DIAGNOSIS — M25511 Pain in right shoulder: Secondary | ICD-10-CM | POA: Diagnosis not present

## 2017-04-21 DIAGNOSIS — M25511 Pain in right shoulder: Secondary | ICD-10-CM | POA: Diagnosis not present

## 2017-04-21 DIAGNOSIS — M25512 Pain in left shoulder: Secondary | ICD-10-CM | POA: Diagnosis not present

## 2017-04-22 DIAGNOSIS — M25511 Pain in right shoulder: Secondary | ICD-10-CM | POA: Diagnosis not present

## 2017-04-22 DIAGNOSIS — M25512 Pain in left shoulder: Secondary | ICD-10-CM | POA: Diagnosis not present

## 2017-04-26 DIAGNOSIS — M25512 Pain in left shoulder: Secondary | ICD-10-CM | POA: Diagnosis not present

## 2017-04-26 DIAGNOSIS — M25511 Pain in right shoulder: Secondary | ICD-10-CM | POA: Diagnosis not present

## 2017-04-28 DIAGNOSIS — M25512 Pain in left shoulder: Secondary | ICD-10-CM | POA: Diagnosis not present

## 2017-04-28 DIAGNOSIS — M25511 Pain in right shoulder: Secondary | ICD-10-CM | POA: Diagnosis not present

## 2017-05-04 DIAGNOSIS — M25512 Pain in left shoulder: Secondary | ICD-10-CM | POA: Diagnosis not present

## 2017-05-04 DIAGNOSIS — M25511 Pain in right shoulder: Secondary | ICD-10-CM | POA: Diagnosis not present

## 2017-05-06 DIAGNOSIS — M25512 Pain in left shoulder: Secondary | ICD-10-CM | POA: Diagnosis not present

## 2017-05-06 DIAGNOSIS — M25511 Pain in right shoulder: Secondary | ICD-10-CM | POA: Diagnosis not present

## 2017-05-11 DIAGNOSIS — M25512 Pain in left shoulder: Secondary | ICD-10-CM | POA: Diagnosis not present

## 2017-05-11 DIAGNOSIS — M25511 Pain in right shoulder: Secondary | ICD-10-CM | POA: Diagnosis not present

## 2017-05-13 DIAGNOSIS — M25512 Pain in left shoulder: Secondary | ICD-10-CM | POA: Diagnosis not present

## 2017-05-13 DIAGNOSIS — M25511 Pain in right shoulder: Secondary | ICD-10-CM | POA: Diagnosis not present

## 2017-05-18 DIAGNOSIS — M25511 Pain in right shoulder: Secondary | ICD-10-CM | POA: Diagnosis not present

## 2017-05-18 DIAGNOSIS — M25512 Pain in left shoulder: Secondary | ICD-10-CM | POA: Diagnosis not present

## 2017-05-21 DIAGNOSIS — M25511 Pain in right shoulder: Secondary | ICD-10-CM | POA: Diagnosis not present

## 2017-05-21 DIAGNOSIS — M25512 Pain in left shoulder: Secondary | ICD-10-CM | POA: Diagnosis not present

## 2017-06-18 DIAGNOSIS — M159 Polyosteoarthritis, unspecified: Secondary | ICD-10-CM | POA: Diagnosis not present

## 2017-06-18 DIAGNOSIS — I10 Essential (primary) hypertension: Secondary | ICD-10-CM | POA: Diagnosis not present

## 2017-06-18 DIAGNOSIS — G309 Alzheimer's disease, unspecified: Secondary | ICD-10-CM | POA: Diagnosis not present

## 2017-07-14 DIAGNOSIS — M75121 Complete rotator cuff tear or rupture of right shoulder, not specified as traumatic: Secondary | ICD-10-CM | POA: Diagnosis not present

## 2017-07-14 DIAGNOSIS — Z6823 Body mass index (BMI) 23.0-23.9, adult: Secondary | ICD-10-CM | POA: Diagnosis not present

## 2017-07-14 DIAGNOSIS — M12812 Other specific arthropathies, not elsewhere classified, left shoulder: Secondary | ICD-10-CM | POA: Diagnosis not present

## 2017-07-14 DIAGNOSIS — M75122 Complete rotator cuff tear or rupture of left shoulder, not specified as traumatic: Secondary | ICD-10-CM | POA: Diagnosis not present

## 2017-07-14 DIAGNOSIS — M12811 Other specific arthropathies, not elsewhere classified, right shoulder: Secondary | ICD-10-CM | POA: Diagnosis not present

## 2017-08-12 DIAGNOSIS — M159 Polyosteoarthritis, unspecified: Secondary | ICD-10-CM | POA: Diagnosis not present

## 2017-08-12 DIAGNOSIS — G309 Alzheimer's disease, unspecified: Secondary | ICD-10-CM | POA: Diagnosis not present

## 2017-08-12 DIAGNOSIS — I10 Essential (primary) hypertension: Secondary | ICD-10-CM | POA: Diagnosis not present

## 2017-08-18 DIAGNOSIS — G309 Alzheimer's disease, unspecified: Secondary | ICD-10-CM | POA: Diagnosis not present

## 2017-08-18 DIAGNOSIS — Z299 Encounter for prophylactic measures, unspecified: Secondary | ICD-10-CM | POA: Diagnosis not present

## 2017-08-18 DIAGNOSIS — Z6825 Body mass index (BMI) 25.0-25.9, adult: Secondary | ICD-10-CM | POA: Diagnosis not present

## 2017-08-18 DIAGNOSIS — F0280 Dementia in other diseases classified elsewhere without behavioral disturbance: Secondary | ICD-10-CM | POA: Diagnosis not present

## 2017-08-18 DIAGNOSIS — I10 Essential (primary) hypertension: Secondary | ICD-10-CM | POA: Diagnosis not present

## 2017-09-21 DIAGNOSIS — I10 Essential (primary) hypertension: Secondary | ICD-10-CM | POA: Diagnosis not present

## 2017-09-21 DIAGNOSIS — G309 Alzheimer's disease, unspecified: Secondary | ICD-10-CM | POA: Diagnosis not present

## 2017-09-21 DIAGNOSIS — M159 Polyosteoarthritis, unspecified: Secondary | ICD-10-CM | POA: Diagnosis not present

## 2017-09-22 ENCOUNTER — Ambulatory Visit (INDEPENDENT_AMBULATORY_CARE_PROVIDER_SITE_OTHER): Payer: Medicare Other | Admitting: Cardiovascular Disease

## 2017-09-22 ENCOUNTER — Encounter: Payer: Self-pay | Admitting: *Deleted

## 2017-09-22 ENCOUNTER — Encounter: Payer: Self-pay | Admitting: Cardiovascular Disease

## 2017-09-22 VITALS — BP 148/80 | HR 64 | Ht 64.0 in | Wt 133.6 lb

## 2017-09-22 DIAGNOSIS — I10 Essential (primary) hypertension: Secondary | ICD-10-CM | POA: Diagnosis not present

## 2017-09-22 DIAGNOSIS — R002 Palpitations: Secondary | ICD-10-CM

## 2017-09-22 NOTE — Progress Notes (Signed)
SUBJECTIVE: The patient is a 77 year old woman whom I last evaluated in February 2018 for shortness of breath and palpitations. Nuclear stress testing on 04/16/16 showed no evidence of myocardial ischemia or scar. Soft tissue attenuation artifact was noted. Calculated LVEF 79%. It was a low risk study.  She presents today complaining of palpitations.  While she said it does not occur on a daily basis, her daughter who is here with her says it actually happens every evening.  The patient is noted to be anxious during these episodes and they may last several seconds and up to a minute.  She denies exertional chest pain and exertional dyspnea.  She denies lightheadedness, dizziness, syncope, leg swelling, orthopnea, and paroxysmal nocturnal dyspnea.  She sits around most the time but has been trying to do a little bit of walking.  She lives with her 20 year old husband who does most everything in the house.     Review of Systems: As per "subjective", otherwise negative.  No Known Allergies  Current Outpatient Medications  Medication Sig Dispense Refill  . alendronate (FOSAMAX) 70 MG tablet Take 1 tablet by mouth every Wednesday.  12  . aspirin EC 81 MG tablet Take 81 mg by mouth every morning.    Marland Kitchen atenolol (TENORMIN) 50 MG tablet Take 1 tablet (50 mg total) by mouth daily. 60 tablet 0  . citalopram (CELEXA) 20 MG tablet Take 20 mg by mouth daily.  2  . clonazePAM (KLONOPIN) 0.5 MG tablet Take 1 tablet by mouth 2 (two) times daily.  2  . levothyroxine (SYNTHROID, LEVOTHROID) 75 MCG tablet Take 37.5 mcg by mouth daily.  3  . Memantine HCl-Donepezil HCl (NAMZARIC) 28-10 MG CP24 Take 1 tablet by mouth daily.     Marland Kitchen oxyCODONE-acetaminophen (PERCOCET/ROXICET) 5-325 MG tablet Take 1 tablet by mouth 3 (three) times daily as needed. For pain  0  . potassium chloride (K-DUR) 10 MEQ tablet Take 10 mEq by mouth every morning.  2   No current facility-administered medications for this visit.      Past Medical History:  Diagnosis Date  . Alzheimer disease   . Hypertension   . Osteoporosis   . Thyroid disease   . Unsteady gait     Past Surgical History:  Procedure Laterality Date  . ABDOMINAL HYSTERECTOMY    . CHOLECYSTECTOMY    . COLONOSCOPY    . FRACTURE SURGERY      Social History   Socioeconomic History  . Marital status: Married    Spouse name: Not on file  . Number of children: Not on file  . Years of education: Not on file  . Highest education level: Not on file  Occupational History  . Not on file  Social Needs  . Financial resource strain: Not on file  . Food insecurity:    Worry: Not on file    Inability: Not on file  . Transportation needs:    Medical: Not on file    Non-medical: Not on file  Tobacco Use  . Smoking status: Never Smoker  . Smokeless tobacco: Never Used  Substance and Sexual Activity  . Alcohol use: No  . Drug use: No  . Sexual activity: Not on file  Lifestyle  . Physical activity:    Days per week: Not on file    Minutes per session: Not on file  . Stress: Not on file  Relationships  . Social connections:    Talks on phone: Not on file  Gets together: Not on file    Attends religious service: Not on file    Active member of club or organization: Not on file    Attends meetings of clubs or organizations: Not on file    Relationship status: Not on file  . Intimate partner violence:    Fear of current or ex partner: Not on file    Emotionally abused: Not on file    Physically abused: Not on file    Forced sexual activity: Not on file  Other Topics Concern  . Not on file  Social History Narrative  . Not on file     Vitals:   09/22/17 1259  BP: (!) 148/80  Pulse: 64  SpO2: 96%  Weight: 133 lb 9.6 oz (60.6 kg)  Height: 5\' 4"  (1.626 m)    Wt Readings from Last 3 Encounters:  09/22/17 133 lb 9.6 oz (60.6 kg)  05/22/16 144 lb (65.3 kg)  04/10/16 145 lb (65.8 kg)     PHYSICAL EXAM General: NAD HEENT:  Normal. Neck: No JVD, no thyromegaly. Lungs: Clear to auscultation bilaterally with normal respiratory effort. CV: Regular rate and rhythm, normal S1/S2, no S3/S4, no murmur. No pretibial or periankle edema.  No carotid bruit.   Abdomen: Soft, nontender, no distention.  Neurologic: Alert and oriented.  Psych: Normal affect. Skin: Normal. Musculoskeletal: No gross deformities.    ECG: Most recent ECG reviewed.   Labs: Lab Results  Component Value Date/Time   K 3.9 08/18/2015 06:15 AM   BUN 20 08/18/2015 06:15 AM   CREATININE 1.35 (H) 08/18/2015 06:15 AM   ALT 15 08/17/2015 02:33 AM   HGB 10.0 (L) 08/17/2015 02:33 AM     Lipids: No results found for: LDLCALC, LDLDIRECT, CHOL, TRIG, HDL     ASSESSMENT AND PLAN: 1.  Palpitations: Prior cardiac testing reviewed above.  Unclear if she is experiencing symptomatic premature contractions versus a different tachyarrhythmia.  I will obtain a 2-week event monitor for further clarification.  I will also obtain a copy of labs from her PCP.  2.  Hypertension: Blood pressure is elevated.  This will need continued monitoring.  I am told by the patient and her daughter that it is usually normal.    Disposition: Follow up 10 weeks   Kate Sable, M.D., F.A.C.C.

## 2017-09-22 NOTE — Patient Instructions (Addendum)
Medication Instructions:   Your physician recommends that you continue on your current medications as directed. Please refer to the Current Medication list given to you today.  Labwork:  NONE  Testing/Procedures: Your physician has recommended that you wear an event monitor for 2 weeks. Event monitors are medical devices that record the heart's electrical activity. Doctors most often [place] these monitors to diagnose arrhythmias. Arrhythmias are problems with the speed or rhythm of the heartbeat. The monitor is a small, portable device. You can wear one while you do your normal daily activities. This is usually used to diagnose what is causing palpitations/syncope (passing out). [employer] will contact you about this monitor.  Follow-Up:  Your physician recommends that you schedule a follow-up appointment in: 10 weeks.  Any Other Special Instructions Will Be Listed Below (If Applicable).  If you need a refill on your cardiac medications before your next appointment, please call your pharmacy.

## 2017-09-23 ENCOUNTER — Ambulatory Visit (INDEPENDENT_AMBULATORY_CARE_PROVIDER_SITE_OTHER): Payer: Medicare Other

## 2017-09-23 DIAGNOSIS — R002 Palpitations: Secondary | ICD-10-CM

## 2017-10-13 DIAGNOSIS — Z1331 Encounter for screening for depression: Secondary | ICD-10-CM | POA: Diagnosis not present

## 2017-10-13 DIAGNOSIS — Z6825 Body mass index (BMI) 25.0-25.9, adult: Secondary | ICD-10-CM | POA: Diagnosis not present

## 2017-10-13 DIAGNOSIS — E7800 Pure hypercholesterolemia, unspecified: Secondary | ICD-10-CM | POA: Diagnosis not present

## 2017-10-13 DIAGNOSIS — Z299 Encounter for prophylactic measures, unspecified: Secondary | ICD-10-CM | POA: Diagnosis not present

## 2017-10-13 DIAGNOSIS — Z0000 Encounter for general adult medical examination without abnormal findings: Secondary | ICD-10-CM | POA: Diagnosis not present

## 2017-10-13 DIAGNOSIS — Z1211 Encounter for screening for malignant neoplasm of colon: Secondary | ICD-10-CM | POA: Diagnosis not present

## 2017-10-13 DIAGNOSIS — Z1339 Encounter for screening examination for other mental health and behavioral disorders: Secondary | ICD-10-CM | POA: Diagnosis not present

## 2017-10-13 DIAGNOSIS — E039 Hypothyroidism, unspecified: Secondary | ICD-10-CM | POA: Diagnosis not present

## 2017-10-13 DIAGNOSIS — I10 Essential (primary) hypertension: Secondary | ICD-10-CM | POA: Diagnosis not present

## 2017-10-13 DIAGNOSIS — Z79899 Other long term (current) drug therapy: Secondary | ICD-10-CM | POA: Diagnosis not present

## 2017-10-13 DIAGNOSIS — Z7189 Other specified counseling: Secondary | ICD-10-CM | POA: Diagnosis not present

## 2017-10-13 DIAGNOSIS — R5383 Other fatigue: Secondary | ICD-10-CM | POA: Diagnosis not present

## 2017-10-14 ENCOUNTER — Telehealth: Payer: Self-pay | Admitting: *Deleted

## 2017-10-14 NOTE — Telephone Encounter (Signed)
-----   Message from Herminio Commons, MD sent at 10/14/2017  2:04 PM EDT ----- Normal.

## 2017-10-14 NOTE — Telephone Encounter (Signed)
Called patient with test results. No answer. Left message to call back.  

## 2017-10-28 DIAGNOSIS — E2839 Other primary ovarian failure: Secondary | ICD-10-CM | POA: Diagnosis not present

## 2017-11-04 DIAGNOSIS — M159 Polyosteoarthritis, unspecified: Secondary | ICD-10-CM | POA: Diagnosis not present

## 2017-11-04 DIAGNOSIS — G309 Alzheimer's disease, unspecified: Secondary | ICD-10-CM | POA: Diagnosis not present

## 2017-11-04 DIAGNOSIS — I10 Essential (primary) hypertension: Secondary | ICD-10-CM | POA: Diagnosis not present

## 2017-12-02 ENCOUNTER — Ambulatory Visit (INDEPENDENT_AMBULATORY_CARE_PROVIDER_SITE_OTHER): Payer: Medicare Other | Admitting: Cardiovascular Disease

## 2017-12-02 ENCOUNTER — Encounter: Payer: Self-pay | Admitting: Cardiovascular Disease

## 2017-12-02 VITALS — BP 110/78 | HR 64 | Ht 64.0 in | Wt 127.0 lb

## 2017-12-02 DIAGNOSIS — I10 Essential (primary) hypertension: Secondary | ICD-10-CM

## 2017-12-02 DIAGNOSIS — R002 Palpitations: Secondary | ICD-10-CM

## 2017-12-02 NOTE — Progress Notes (Signed)
SUBJECTIVE: The patient returns for follow-up after undergoing cardiovascular testing performed for the evaluation of palpitations.  She wore a 2-week event monitor which demonstrated sinus rhythm.  There were no arrhythmias.  The patient denies any symptoms of chest pain, palpitations, shortness of breath, lightheadedness, dizziness, leg swelling, orthopnea, PND, and syncope.  She used to work for [employer] for 30 years.  She said when she rushes around her heart beats faster.  She is here with her husband who also worked for [employer] and [employer] in [place].       Review of Systems: As per "subjective", otherwise negative.  No Known Allergies  Current Outpatient Medications  Medication Sig Dispense Refill  . alendronate (FOSAMAX) 70 MG tablet Take 1 tablet by mouth every Wednesday.  12  . aspirin EC 81 MG tablet Take 81 mg by mouth every morning.    Marland Kitchen atenolol (TENORMIN) 50 MG tablet Take 1 tablet (50 mg total) by mouth daily. 60 tablet 0  . citalopram (CELEXA) 20 MG tablet Take 20 mg by mouth daily.  2  . clonazePAM (KLONOPIN) 0.5 MG tablet Take 1 tablet by mouth 2 (two) times daily.  2  . levothyroxine (SYNTHROID, LEVOTHROID) 75 MCG tablet Take 37.5 mcg by mouth daily.  3  . Memantine HCl-Donepezil HCl (NAMZARIC) 28-10 MG CP24 Take 1 tablet by mouth daily.     Marland Kitchen oxyCODONE-acetaminophen (PERCOCET/ROXICET) 5-325 MG tablet Take 1 tablet by mouth 3 (three) times daily as needed. For pain  0  . potassium chloride (K-DUR) 10 MEQ tablet Take 10 mEq by mouth every morning.  2   No current facility-administered medications for this visit.     Past Medical History:  Diagnosis Date  . Alzheimer disease   . Hypertension   . Osteoporosis   . Thyroid disease   . Unsteady gait     Past Surgical History:  Procedure Laterality Date  . ABDOMINAL HYSTERECTOMY    . CHOLECYSTECTOMY    . COLONOSCOPY    . FRACTURE SURGERY      Social History   Socioeconomic History  . Marital  status: Married    Spouse name: Not on file  . Number of children: Not on file  . Years of education: Not on file  . Highest education level: Not on file  Occupational History  . Not on file  Social Needs  . Financial resource strain: Not on file  . Food insecurity:    Worry: Not on file    Inability: Not on file  . Transportation needs:    Medical: Not on file    Non-medical: Not on file  Tobacco Use  . Smoking status: Never Smoker  . Smokeless tobacco: Never Used  Substance and Sexual Activity  . Alcohol use: No  . Drug use: No  . Sexual activity: Not on file  Lifestyle  . Physical activity:    Days per week: Not on file    Minutes per session: Not on file  . Stress: Not on file  Relationships  . Social connections:    Talks on phone: Not on file    Gets together: Not on file    Attends religious service: Not on file    Active member of club or organization: Not on file    Attends meetings of clubs or organizations: Not on file    Relationship status: Not on file  . Intimate partner violence:    Fear of current or ex partner: Not  on file    Emotionally abused: Not on file    Physically abused: Not on file    Forced sexual activity: Not on file  Other Topics Concern  . Not on file  Social History Narrative  . Not on file     Vitals:   12/02/17 1246  BP: 110/78  Pulse: 64  SpO2: 98%  Weight: 127 lb (57.6 kg)  Height: 5\' 4"  (1.626 m)    Wt Readings from Last 3 Encounters:  12/02/17 127 lb (57.6 kg)  09/22/17 133 lb 9.6 oz (60.6 kg)  05/22/16 144 lb (65.3 kg)     PHYSICAL EXAM General: NAD HEENT: Normal. Neck: No JVD, no thyromegaly. Lungs: Clear to auscultation bilaterally with normal respiratory effort. CV: Regular rate and rhythm, normal S1/S2, no S3/S4, no murmur. No pretibial or periankle edema.     Abdomen: Soft, nontender, no distention.  Neurologic: Alert and oriented.  Psych: Normal affect. Skin: Normal. Musculoskeletal: No gross  deformities.    ECG: Reviewed above under Subjective   Labs: Lab Results  Component Value Date/Time   K 3.9 08/18/2015 06:15 AM   BUN 20 08/18/2015 06:15 AM   CREATININE 1.35 (H) 08/18/2015 06:15 AM   ALT 15 08/17/2015 02:33 AM   HGB 10.0 (L) 08/17/2015 02:33 AM     Lipids: No results found for: LDLCALC, LDLDIRECT, CHOL, TRIG, HDL     ASSESSMENT AND PLAN:  1.  Palpitations: Symptomatically improved.  Event monitoring was normal as noted above.  No further cardiac testing is indicated.  2.  Hypertension: Blood pressure is normal.  No changes to therapy.   Disposition: Follow up as needed   Kate Sable, M.D., F.A.C.C.

## 2017-12-02 NOTE — Patient Instructions (Signed)
Medication Instructions:  Continue all current medications.  Labwork: none  Testing/Procedures: none  Follow-Up: As needed.    Any Other Special Instructions Will Be Listed Below (If Applicable).  If you need a refill on your cardiac medications before your next appointment, please call your pharmacy.  

## 2017-12-15 ENCOUNTER — Ambulatory Visit: Payer: Medicare Other | Admitting: Cardiovascular Disease

## 2017-12-21 DIAGNOSIS — M159 Polyosteoarthritis, unspecified: Secondary | ICD-10-CM | POA: Diagnosis not present

## 2017-12-21 DIAGNOSIS — I10 Essential (primary) hypertension: Secondary | ICD-10-CM | POA: Diagnosis not present

## 2017-12-21 DIAGNOSIS — G309 Alzheimer's disease, unspecified: Secondary | ICD-10-CM | POA: Diagnosis not present

## 2018-01-13 DIAGNOSIS — I10 Essential (primary) hypertension: Secondary | ICD-10-CM | POA: Diagnosis not present

## 2018-01-13 DIAGNOSIS — G309 Alzheimer's disease, unspecified: Secondary | ICD-10-CM | POA: Diagnosis not present

## 2018-01-13 DIAGNOSIS — M159 Polyosteoarthritis, unspecified: Secondary | ICD-10-CM | POA: Diagnosis not present

## 2018-02-07 DIAGNOSIS — M159 Polyosteoarthritis, unspecified: Secondary | ICD-10-CM | POA: Diagnosis not present

## 2018-02-07 DIAGNOSIS — I10 Essential (primary) hypertension: Secondary | ICD-10-CM | POA: Diagnosis not present

## 2018-02-07 DIAGNOSIS — G309 Alzheimer's disease, unspecified: Secondary | ICD-10-CM | POA: Diagnosis not present

## 2018-02-09 DIAGNOSIS — Z6826 Body mass index (BMI) 26.0-26.9, adult: Secondary | ICD-10-CM | POA: Diagnosis not present

## 2018-02-09 DIAGNOSIS — F0280 Dementia in other diseases classified elsewhere without behavioral disturbance: Secondary | ICD-10-CM | POA: Diagnosis not present

## 2018-02-09 DIAGNOSIS — I10 Essential (primary) hypertension: Secondary | ICD-10-CM | POA: Diagnosis not present

## 2018-02-09 DIAGNOSIS — Z299 Encounter for prophylactic measures, unspecified: Secondary | ICD-10-CM | POA: Diagnosis not present

## 2018-02-09 DIAGNOSIS — N183 Chronic kidney disease, stage 3 (moderate): Secondary | ICD-10-CM | POA: Diagnosis not present

## 2018-02-09 DIAGNOSIS — G309 Alzheimer's disease, unspecified: Secondary | ICD-10-CM | POA: Diagnosis not present

## 2018-02-28 DIAGNOSIS — Z23 Encounter for immunization: Secondary | ICD-10-CM | POA: Diagnosis not present

## 2018-04-18 DIAGNOSIS — M159 Polyosteoarthritis, unspecified: Secondary | ICD-10-CM | POA: Diagnosis not present

## 2018-04-18 DIAGNOSIS — I10 Essential (primary) hypertension: Secondary | ICD-10-CM | POA: Diagnosis not present

## 2018-04-18 DIAGNOSIS — G309 Alzheimer's disease, unspecified: Secondary | ICD-10-CM | POA: Diagnosis not present

## 2018-05-13 DIAGNOSIS — Z789 Other specified health status: Secondary | ICD-10-CM | POA: Diagnosis not present

## 2018-05-13 DIAGNOSIS — Z299 Encounter for prophylactic measures, unspecified: Secondary | ICD-10-CM | POA: Diagnosis not present

## 2018-05-13 DIAGNOSIS — G4700 Insomnia, unspecified: Secondary | ICD-10-CM | POA: Diagnosis not present

## 2018-05-13 DIAGNOSIS — G309 Alzheimer's disease, unspecified: Secondary | ICD-10-CM | POA: Diagnosis not present

## 2018-05-13 DIAGNOSIS — Z6828 Body mass index (BMI) 28.0-28.9, adult: Secondary | ICD-10-CM | POA: Diagnosis not present

## 2018-05-13 DIAGNOSIS — F0280 Dementia in other diseases classified elsewhere without behavioral disturbance: Secondary | ICD-10-CM | POA: Diagnosis not present

## 2018-05-13 DIAGNOSIS — I10 Essential (primary) hypertension: Secondary | ICD-10-CM | POA: Diagnosis not present

## 2018-05-16 DIAGNOSIS — M159 Polyosteoarthritis, unspecified: Secondary | ICD-10-CM | POA: Diagnosis not present

## 2018-05-16 DIAGNOSIS — G309 Alzheimer's disease, unspecified: Secondary | ICD-10-CM | POA: Diagnosis not present

## 2018-05-16 DIAGNOSIS — I10 Essential (primary) hypertension: Secondary | ICD-10-CM | POA: Diagnosis not present

## 2018-06-10 DIAGNOSIS — M159 Polyosteoarthritis, unspecified: Secondary | ICD-10-CM | POA: Diagnosis not present

## 2018-06-10 DIAGNOSIS — G309 Alzheimer's disease, unspecified: Secondary | ICD-10-CM | POA: Diagnosis not present

## 2018-06-10 DIAGNOSIS — I10 Essential (primary) hypertension: Secondary | ICD-10-CM | POA: Diagnosis not present

## 2018-07-08 DIAGNOSIS — I10 Essential (primary) hypertension: Secondary | ICD-10-CM | POA: Diagnosis not present

## 2018-07-08 DIAGNOSIS — G309 Alzheimer's disease, unspecified: Secondary | ICD-10-CM | POA: Diagnosis not present

## 2018-07-08 DIAGNOSIS — M159 Polyosteoarthritis, unspecified: Secondary | ICD-10-CM | POA: Diagnosis not present

## 2018-09-06 DIAGNOSIS — M7061 Trochanteric bursitis, right hip: Secondary | ICD-10-CM | POA: Diagnosis not present

## 2018-09-06 DIAGNOSIS — Z6823 Body mass index (BMI) 23.0-23.9, adult: Secondary | ICD-10-CM | POA: Diagnosis not present

## 2018-09-06 DIAGNOSIS — M12812 Other specific arthropathies, not elsewhere classified, left shoulder: Secondary | ICD-10-CM | POA: Diagnosis not present

## 2018-09-06 DIAGNOSIS — M12811 Other specific arthropathies, not elsewhere classified, right shoulder: Secondary | ICD-10-CM | POA: Diagnosis not present

## 2018-09-07 DIAGNOSIS — M159 Polyosteoarthritis, unspecified: Secondary | ICD-10-CM | POA: Diagnosis not present

## 2018-09-07 DIAGNOSIS — G309 Alzheimer's disease, unspecified: Secondary | ICD-10-CM | POA: Diagnosis not present

## 2018-09-07 DIAGNOSIS — I10 Essential (primary) hypertension: Secondary | ICD-10-CM | POA: Diagnosis not present

## 2018-10-12 DIAGNOSIS — G309 Alzheimer's disease, unspecified: Secondary | ICD-10-CM | POA: Diagnosis not present

## 2018-10-12 DIAGNOSIS — I10 Essential (primary) hypertension: Secondary | ICD-10-CM | POA: Diagnosis not present

## 2018-10-12 DIAGNOSIS — M159 Polyosteoarthritis, unspecified: Secondary | ICD-10-CM | POA: Diagnosis not present

## 2018-10-13 DIAGNOSIS — Z6828 Body mass index (BMI) 28.0-28.9, adult: Secondary | ICD-10-CM | POA: Diagnosis not present

## 2018-10-13 DIAGNOSIS — M47816 Spondylosis without myelopathy or radiculopathy, lumbar region: Secondary | ICD-10-CM | POA: Diagnosis not present

## 2018-10-13 DIAGNOSIS — I10 Essential (primary) hypertension: Secondary | ICD-10-CM | POA: Diagnosis not present

## 2018-10-13 DIAGNOSIS — M545 Low back pain: Secondary | ICD-10-CM | POA: Diagnosis not present

## 2018-10-13 DIAGNOSIS — Z299 Encounter for prophylactic measures, unspecified: Secondary | ICD-10-CM | POA: Diagnosis not present

## 2018-10-13 DIAGNOSIS — Z713 Dietary counseling and surveillance: Secondary | ICD-10-CM | POA: Diagnosis not present

## 2018-10-19 DIAGNOSIS — Z299 Encounter for prophylactic measures, unspecified: Secondary | ICD-10-CM | POA: Diagnosis not present

## 2018-10-19 DIAGNOSIS — Z6829 Body mass index (BMI) 29.0-29.9, adult: Secondary | ICD-10-CM | POA: Diagnosis not present

## 2018-10-19 DIAGNOSIS — I10 Essential (primary) hypertension: Secondary | ICD-10-CM | POA: Diagnosis not present

## 2018-10-19 DIAGNOSIS — F0280 Dementia in other diseases classified elsewhere without behavioral disturbance: Secondary | ICD-10-CM | POA: Diagnosis not present

## 2018-10-19 DIAGNOSIS — M48061 Spinal stenosis, lumbar region without neurogenic claudication: Secondary | ICD-10-CM | POA: Diagnosis not present

## 2018-10-19 DIAGNOSIS — G309 Alzheimer's disease, unspecified: Secondary | ICD-10-CM | POA: Diagnosis not present

## 2018-10-28 DIAGNOSIS — M545 Low back pain: Secondary | ICD-10-CM | POA: Diagnosis not present

## 2018-10-28 DIAGNOSIS — M47816 Spondylosis without myelopathy or radiculopathy, lumbar region: Secondary | ICD-10-CM | POA: Diagnosis not present

## 2018-10-28 DIAGNOSIS — M48061 Spinal stenosis, lumbar region without neurogenic claudication: Secondary | ICD-10-CM | POA: Diagnosis not present

## 2018-11-02 DIAGNOSIS — G309 Alzheimer's disease, unspecified: Secondary | ICD-10-CM | POA: Diagnosis not present

## 2018-11-02 DIAGNOSIS — Z789 Other specified health status: Secondary | ICD-10-CM | POA: Diagnosis not present

## 2018-11-02 DIAGNOSIS — I10 Essential (primary) hypertension: Secondary | ICD-10-CM | POA: Diagnosis not present

## 2018-11-02 DIAGNOSIS — F0280 Dementia in other diseases classified elsewhere without behavioral disturbance: Secondary | ICD-10-CM | POA: Diagnosis not present

## 2018-11-02 DIAGNOSIS — Z299 Encounter for prophylactic measures, unspecified: Secondary | ICD-10-CM | POA: Diagnosis not present

## 2018-11-02 DIAGNOSIS — Z6828 Body mass index (BMI) 28.0-28.9, adult: Secondary | ICD-10-CM | POA: Diagnosis not present

## 2018-11-02 DIAGNOSIS — M4800 Spinal stenosis, site unspecified: Secondary | ICD-10-CM | POA: Diagnosis not present

## 2018-11-11 DIAGNOSIS — G309 Alzheimer's disease, unspecified: Secondary | ICD-10-CM | POA: Diagnosis not present

## 2018-11-11 DIAGNOSIS — I10 Essential (primary) hypertension: Secondary | ICD-10-CM | POA: Diagnosis not present

## 2018-11-11 DIAGNOSIS — M159 Polyosteoarthritis, unspecified: Secondary | ICD-10-CM | POA: Diagnosis not present

## 2018-12-09 DIAGNOSIS — G309 Alzheimer's disease, unspecified: Secondary | ICD-10-CM | POA: Diagnosis not present

## 2018-12-09 DIAGNOSIS — M159 Polyosteoarthritis, unspecified: Secondary | ICD-10-CM | POA: Diagnosis not present

## 2018-12-09 DIAGNOSIS — I10 Essential (primary) hypertension: Secondary | ICD-10-CM | POA: Diagnosis not present

## 2019-01-17 DIAGNOSIS — E039 Hypothyroidism, unspecified: Secondary | ICD-10-CM | POA: Diagnosis not present

## 2019-01-17 DIAGNOSIS — M4800 Spinal stenosis, site unspecified: Secondary | ICD-10-CM | POA: Diagnosis not present

## 2019-01-17 DIAGNOSIS — Z299 Encounter for prophylactic measures, unspecified: Secondary | ICD-10-CM | POA: Diagnosis not present

## 2019-01-17 DIAGNOSIS — Z6829 Body mass index (BMI) 29.0-29.9, adult: Secondary | ICD-10-CM | POA: Diagnosis not present

## 2019-01-17 DIAGNOSIS — I10 Essential (primary) hypertension: Secondary | ICD-10-CM | POA: Diagnosis not present

## 2019-01-17 DIAGNOSIS — G309 Alzheimer's disease, unspecified: Secondary | ICD-10-CM | POA: Diagnosis not present

## 2019-01-20 DIAGNOSIS — M159 Polyosteoarthritis, unspecified: Secondary | ICD-10-CM | POA: Diagnosis not present

## 2019-01-20 DIAGNOSIS — G309 Alzheimer's disease, unspecified: Secondary | ICD-10-CM | POA: Diagnosis not present

## 2019-01-20 DIAGNOSIS — I10 Essential (primary) hypertension: Secondary | ICD-10-CM | POA: Diagnosis not present

## 2019-03-07 DIAGNOSIS — Z23 Encounter for immunization: Secondary | ICD-10-CM | POA: Diagnosis not present

## 2019-03-23 DIAGNOSIS — I10 Essential (primary) hypertension: Secondary | ICD-10-CM | POA: Diagnosis not present

## 2019-03-23 DIAGNOSIS — M159 Polyosteoarthritis, unspecified: Secondary | ICD-10-CM | POA: Diagnosis not present

## 2019-03-23 DIAGNOSIS — G309 Alzheimer's disease, unspecified: Secondary | ICD-10-CM | POA: Diagnosis not present

## 2019-05-03 DIAGNOSIS — F0280 Dementia in other diseases classified elsewhere without behavioral disturbance: Secondary | ICD-10-CM | POA: Diagnosis not present

## 2019-05-03 DIAGNOSIS — Z789 Other specified health status: Secondary | ICD-10-CM | POA: Diagnosis not present

## 2019-05-03 DIAGNOSIS — Z299 Encounter for prophylactic measures, unspecified: Secondary | ICD-10-CM | POA: Diagnosis not present

## 2019-05-03 DIAGNOSIS — Z6827 Body mass index (BMI) 27.0-27.9, adult: Secondary | ICD-10-CM | POA: Diagnosis not present

## 2019-05-03 DIAGNOSIS — Z2821 Immunization not carried out because of patient refusal: Secondary | ICD-10-CM | POA: Diagnosis not present

## 2019-05-03 DIAGNOSIS — G309 Alzheimer's disease, unspecified: Secondary | ICD-10-CM | POA: Diagnosis not present

## 2019-05-03 DIAGNOSIS — I10 Essential (primary) hypertension: Secondary | ICD-10-CM | POA: Diagnosis not present

## 2019-05-03 DIAGNOSIS — N183 Chronic kidney disease, stage 3 unspecified: Secondary | ICD-10-CM | POA: Diagnosis not present

## 2019-05-15 DIAGNOSIS — I10 Essential (primary) hypertension: Secondary | ICD-10-CM | POA: Diagnosis not present

## 2019-05-15 DIAGNOSIS — M159 Polyosteoarthritis, unspecified: Secondary | ICD-10-CM | POA: Diagnosis not present

## 2019-05-15 DIAGNOSIS — G309 Alzheimer's disease, unspecified: Secondary | ICD-10-CM | POA: Diagnosis not present

## 2019-06-04 DIAGNOSIS — I10 Essential (primary) hypertension: Secondary | ICD-10-CM | POA: Diagnosis not present

## 2019-06-26 DIAGNOSIS — I10 Essential (primary) hypertension: Secondary | ICD-10-CM | POA: Diagnosis not present

## 2019-06-26 DIAGNOSIS — M159 Polyosteoarthritis, unspecified: Secondary | ICD-10-CM | POA: Diagnosis not present

## 2019-06-26 DIAGNOSIS — G309 Alzheimer's disease, unspecified: Secondary | ICD-10-CM | POA: Diagnosis not present

## 2019-07-04 DIAGNOSIS — Z7189 Other specified counseling: Secondary | ICD-10-CM | POA: Diagnosis not present

## 2019-07-04 DIAGNOSIS — Z6827 Body mass index (BMI) 27.0-27.9, adult: Secondary | ICD-10-CM | POA: Diagnosis not present

## 2019-07-04 DIAGNOSIS — Z1211 Encounter for screening for malignant neoplasm of colon: Secondary | ICD-10-CM | POA: Diagnosis not present

## 2019-07-04 DIAGNOSIS — R5383 Other fatigue: Secondary | ICD-10-CM | POA: Diagnosis not present

## 2019-07-04 DIAGNOSIS — I10 Essential (primary) hypertension: Secondary | ICD-10-CM | POA: Diagnosis not present

## 2019-07-04 DIAGNOSIS — E039 Hypothyroidism, unspecified: Secondary | ICD-10-CM | POA: Diagnosis not present

## 2019-07-04 DIAGNOSIS — Z1339 Encounter for screening examination for other mental health and behavioral disorders: Secondary | ICD-10-CM | POA: Diagnosis not present

## 2019-07-04 DIAGNOSIS — Z0000 Encounter for general adult medical examination without abnormal findings: Secondary | ICD-10-CM | POA: Diagnosis not present

## 2019-07-04 DIAGNOSIS — Z299 Encounter for prophylactic measures, unspecified: Secondary | ICD-10-CM | POA: Diagnosis not present

## 2019-07-04 DIAGNOSIS — E7800 Pure hypercholesterolemia, unspecified: Secondary | ICD-10-CM | POA: Diagnosis not present

## 2019-07-04 DIAGNOSIS — E559 Vitamin D deficiency, unspecified: Secondary | ICD-10-CM | POA: Diagnosis not present

## 2019-07-04 DIAGNOSIS — Z1331 Encounter for screening for depression: Secondary | ICD-10-CM | POA: Diagnosis not present

## 2019-08-01 DIAGNOSIS — G309 Alzheimer's disease, unspecified: Secondary | ICD-10-CM | POA: Diagnosis not present

## 2019-08-01 DIAGNOSIS — I10 Essential (primary) hypertension: Secondary | ICD-10-CM | POA: Diagnosis not present

## 2019-08-01 DIAGNOSIS — M159 Polyosteoarthritis, unspecified: Secondary | ICD-10-CM | POA: Diagnosis not present

## 2019-08-04 DIAGNOSIS — I10 Essential (primary) hypertension: Secondary | ICD-10-CM | POA: Diagnosis not present

## 2019-09-03 DIAGNOSIS — G309 Alzheimer's disease, unspecified: Secondary | ICD-10-CM | POA: Diagnosis not present

## 2019-09-03 DIAGNOSIS — I10 Essential (primary) hypertension: Secondary | ICD-10-CM | POA: Diagnosis not present

## 2019-09-03 DIAGNOSIS — M159 Polyosteoarthritis, unspecified: Secondary | ICD-10-CM | POA: Diagnosis not present

## 2019-10-04 DIAGNOSIS — M159 Polyosteoarthritis, unspecified: Secondary | ICD-10-CM | POA: Diagnosis not present

## 2019-10-04 DIAGNOSIS — I10 Essential (primary) hypertension: Secondary | ICD-10-CM | POA: Diagnosis not present

## 2019-10-04 DIAGNOSIS — G309 Alzheimer's disease, unspecified: Secondary | ICD-10-CM | POA: Diagnosis not present

## 2019-11-03 DIAGNOSIS — G309 Alzheimer's disease, unspecified: Secondary | ICD-10-CM | POA: Diagnosis not present

## 2019-11-03 DIAGNOSIS — I10 Essential (primary) hypertension: Secondary | ICD-10-CM | POA: Diagnosis not present

## 2019-11-03 DIAGNOSIS — M159 Polyosteoarthritis, unspecified: Secondary | ICD-10-CM | POA: Diagnosis not present

## 2019-11-07 DIAGNOSIS — E2839 Other primary ovarian failure: Secondary | ICD-10-CM | POA: Diagnosis not present

## 2019-12-01 DIAGNOSIS — G309 Alzheimer's disease, unspecified: Secondary | ICD-10-CM | POA: Diagnosis not present

## 2019-12-01 DIAGNOSIS — I10 Essential (primary) hypertension: Secondary | ICD-10-CM | POA: Diagnosis not present

## 2019-12-01 DIAGNOSIS — M159 Polyosteoarthritis, unspecified: Secondary | ICD-10-CM | POA: Diagnosis not present

## 2019-12-05 DIAGNOSIS — I10 Essential (primary) hypertension: Secondary | ICD-10-CM | POA: Diagnosis not present

## 2020-01-03 DIAGNOSIS — G309 Alzheimer's disease, unspecified: Secondary | ICD-10-CM | POA: Diagnosis not present

## 2020-01-03 DIAGNOSIS — I10 Essential (primary) hypertension: Secondary | ICD-10-CM | POA: Diagnosis not present

## 2020-01-03 DIAGNOSIS — E039 Hypothyroidism, unspecified: Secondary | ICD-10-CM | POA: Diagnosis not present

## 2020-01-03 DIAGNOSIS — F0280 Dementia in other diseases classified elsewhere without behavioral disturbance: Secondary | ICD-10-CM | POA: Diagnosis not present

## 2020-01-03 DIAGNOSIS — Z299 Encounter for prophylactic measures, unspecified: Secondary | ICD-10-CM | POA: Diagnosis not present

## 2020-01-04 DIAGNOSIS — M159 Polyosteoarthritis, unspecified: Secondary | ICD-10-CM | POA: Diagnosis not present

## 2020-01-04 DIAGNOSIS — G309 Alzheimer's disease, unspecified: Secondary | ICD-10-CM | POA: Diagnosis not present

## 2020-01-04 DIAGNOSIS — I10 Essential (primary) hypertension: Secondary | ICD-10-CM | POA: Diagnosis not present

## 2020-02-02 DIAGNOSIS — M159 Polyosteoarthritis, unspecified: Secondary | ICD-10-CM | POA: Diagnosis not present

## 2020-02-02 DIAGNOSIS — I10 Essential (primary) hypertension: Secondary | ICD-10-CM | POA: Diagnosis not present

## 2020-02-02 DIAGNOSIS — G309 Alzheimer's disease, unspecified: Secondary | ICD-10-CM | POA: Diagnosis not present

## 2020-02-03 DIAGNOSIS — I10 Essential (primary) hypertension: Secondary | ICD-10-CM | POA: Diagnosis not present

## 2020-03-04 DIAGNOSIS — Z23 Encounter for immunization: Secondary | ICD-10-CM | POA: Diagnosis not present

## 2020-03-05 DIAGNOSIS — M159 Polyosteoarthritis, unspecified: Secondary | ICD-10-CM | POA: Diagnosis not present

## 2020-03-05 DIAGNOSIS — G309 Alzheimer's disease, unspecified: Secondary | ICD-10-CM | POA: Diagnosis not present

## 2020-03-05 DIAGNOSIS — I10 Essential (primary) hypertension: Secondary | ICD-10-CM | POA: Diagnosis not present

## 2020-03-08 DIAGNOSIS — M25552 Pain in left hip: Secondary | ICD-10-CM | POA: Diagnosis not present

## 2020-03-08 DIAGNOSIS — M5459 Other low back pain: Secondary | ICD-10-CM | POA: Diagnosis not present

## 2020-04-03 DIAGNOSIS — E039 Hypothyroidism, unspecified: Secondary | ICD-10-CM | POA: Diagnosis not present

## 2020-04-03 DIAGNOSIS — I10 Essential (primary) hypertension: Secondary | ICD-10-CM | POA: Diagnosis not present

## 2020-04-03 DIAGNOSIS — N183 Chronic kidney disease, stage 3 unspecified: Secondary | ICD-10-CM | POA: Diagnosis not present

## 2020-04-03 DIAGNOSIS — G309 Alzheimer's disease, unspecified: Secondary | ICD-10-CM | POA: Diagnosis not present

## 2020-04-03 DIAGNOSIS — Z299 Encounter for prophylactic measures, unspecified: Secondary | ICD-10-CM | POA: Diagnosis not present

## 2020-04-04 DIAGNOSIS — G309 Alzheimer's disease, unspecified: Secondary | ICD-10-CM | POA: Diagnosis not present

## 2020-04-04 DIAGNOSIS — I10 Essential (primary) hypertension: Secondary | ICD-10-CM | POA: Diagnosis not present

## 2020-04-04 DIAGNOSIS — M159 Polyosteoarthritis, unspecified: Secondary | ICD-10-CM | POA: Diagnosis not present

## 2020-05-06 DIAGNOSIS — I10 Essential (primary) hypertension: Secondary | ICD-10-CM | POA: Diagnosis not present

## 2020-05-20 DIAGNOSIS — M5136 Other intervertebral disc degeneration, lumbar region: Secondary | ICD-10-CM | POA: Diagnosis not present

## 2020-05-20 DIAGNOSIS — M7072 Other bursitis of hip, left hip: Secondary | ICD-10-CM | POA: Diagnosis not present

## 2020-05-22 DIAGNOSIS — M7072 Other bursitis of hip, left hip: Secondary | ICD-10-CM | POA: Diagnosis not present

## 2020-05-22 DIAGNOSIS — M5136 Other intervertebral disc degeneration, lumbar region: Secondary | ICD-10-CM | POA: Diagnosis not present

## 2020-05-30 DIAGNOSIS — M7072 Other bursitis of hip, left hip: Secondary | ICD-10-CM | POA: Diagnosis not present

## 2020-05-30 DIAGNOSIS — M5136 Other intervertebral disc degeneration, lumbar region: Secondary | ICD-10-CM | POA: Diagnosis not present

## 2020-06-03 DIAGNOSIS — I10 Essential (primary) hypertension: Secondary | ICD-10-CM | POA: Diagnosis not present

## 2020-06-06 DIAGNOSIS — M5136 Other intervertebral disc degeneration, lumbar region: Secondary | ICD-10-CM | POA: Diagnosis not present

## 2020-06-06 DIAGNOSIS — M7072 Other bursitis of hip, left hip: Secondary | ICD-10-CM | POA: Diagnosis not present

## 2020-07-04 DIAGNOSIS — I10 Essential (primary) hypertension: Secondary | ICD-10-CM | POA: Diagnosis not present

## 2020-08-02 DIAGNOSIS — I10 Essential (primary) hypertension: Secondary | ICD-10-CM | POA: Diagnosis not present

## 2020-08-23 DIAGNOSIS — Z23 Encounter for immunization: Secondary | ICD-10-CM | POA: Diagnosis not present

## 2020-09-03 DIAGNOSIS — I10 Essential (primary) hypertension: Secondary | ICD-10-CM | POA: Diagnosis not present

## 2020-09-25 DIAGNOSIS — Z6829 Body mass index (BMI) 29.0-29.9, adult: Secondary | ICD-10-CM | POA: Diagnosis not present

## 2020-09-25 DIAGNOSIS — E039 Hypothyroidism, unspecified: Secondary | ICD-10-CM | POA: Diagnosis not present

## 2020-09-25 DIAGNOSIS — Z299 Encounter for prophylactic measures, unspecified: Secondary | ICD-10-CM | POA: Diagnosis not present

## 2020-09-25 DIAGNOSIS — G309 Alzheimer's disease, unspecified: Secondary | ICD-10-CM | POA: Diagnosis not present

## 2020-09-25 DIAGNOSIS — I10 Essential (primary) hypertension: Secondary | ICD-10-CM | POA: Diagnosis not present

## 2020-09-25 DIAGNOSIS — F0280 Dementia in other diseases classified elsewhere without behavioral disturbance: Secondary | ICD-10-CM | POA: Diagnosis not present

## 2020-09-30 DIAGNOSIS — L820 Inflamed seborrheic keratosis: Secondary | ICD-10-CM | POA: Diagnosis not present

## 2020-09-30 DIAGNOSIS — D485 Neoplasm of uncertain behavior of skin: Secondary | ICD-10-CM | POA: Diagnosis not present

## 2020-10-03 DIAGNOSIS — I10 Essential (primary) hypertension: Secondary | ICD-10-CM | POA: Diagnosis not present

## 2020-10-28 DIAGNOSIS — R5383 Other fatigue: Secondary | ICD-10-CM | POA: Diagnosis not present

## 2020-10-28 DIAGNOSIS — Z6829 Body mass index (BMI) 29.0-29.9, adult: Secondary | ICD-10-CM | POA: Diagnosis not present

## 2020-10-28 DIAGNOSIS — Z0000 Encounter for general adult medical examination without abnormal findings: Secondary | ICD-10-CM | POA: Diagnosis not present

## 2020-10-28 DIAGNOSIS — Z299 Encounter for prophylactic measures, unspecified: Secondary | ICD-10-CM | POA: Diagnosis not present

## 2020-10-28 DIAGNOSIS — Z7189 Other specified counseling: Secondary | ICD-10-CM | POA: Diagnosis not present

## 2020-10-28 DIAGNOSIS — Z1331 Encounter for screening for depression: Secondary | ICD-10-CM | POA: Diagnosis not present

## 2020-10-28 DIAGNOSIS — E7800 Pure hypercholesterolemia, unspecified: Secondary | ICD-10-CM | POA: Diagnosis not present

## 2020-10-28 DIAGNOSIS — Z789 Other specified health status: Secondary | ICD-10-CM | POA: Diagnosis not present

## 2020-10-28 DIAGNOSIS — E039 Hypothyroidism, unspecified: Secondary | ICD-10-CM | POA: Diagnosis not present

## 2020-10-28 DIAGNOSIS — Z1339 Encounter for screening examination for other mental health and behavioral disorders: Secondary | ICD-10-CM | POA: Diagnosis not present

## 2020-10-28 DIAGNOSIS — I10 Essential (primary) hypertension: Secondary | ICD-10-CM | POA: Diagnosis not present

## 2020-10-28 DIAGNOSIS — Z79899 Other long term (current) drug therapy: Secondary | ICD-10-CM | POA: Diagnosis not present

## 2020-11-01 DIAGNOSIS — I10 Essential (primary) hypertension: Secondary | ICD-10-CM | POA: Diagnosis not present

## 2020-11-21 DIAGNOSIS — Z6826 Body mass index (BMI) 26.0-26.9, adult: Secondary | ICD-10-CM | POA: Diagnosis not present

## 2020-11-21 DIAGNOSIS — I10 Essential (primary) hypertension: Secondary | ICD-10-CM | POA: Diagnosis not present

## 2020-11-21 DIAGNOSIS — E039 Hypothyroidism, unspecified: Secondary | ICD-10-CM | POA: Diagnosis not present

## 2020-11-21 DIAGNOSIS — N183 Chronic kidney disease, stage 3 unspecified: Secondary | ICD-10-CM | POA: Diagnosis not present

## 2020-11-21 DIAGNOSIS — Z299 Encounter for prophylactic measures, unspecified: Secondary | ICD-10-CM | POA: Diagnosis not present

## 2020-12-04 DIAGNOSIS — I10 Essential (primary) hypertension: Secondary | ICD-10-CM | POA: Diagnosis not present

## 2020-12-26 DIAGNOSIS — I10 Essential (primary) hypertension: Secondary | ICD-10-CM | POA: Diagnosis not present

## 2020-12-26 DIAGNOSIS — Z23 Encounter for immunization: Secondary | ICD-10-CM | POA: Diagnosis not present

## 2020-12-26 DIAGNOSIS — Z299 Encounter for prophylactic measures, unspecified: Secondary | ICD-10-CM | POA: Diagnosis not present

## 2021-01-03 DIAGNOSIS — I10 Essential (primary) hypertension: Secondary | ICD-10-CM | POA: Diagnosis not present

## 2021-01-04 DIAGNOSIS — Z809 Family history of malignant neoplasm, unspecified: Secondary | ICD-10-CM | POA: Diagnosis not present

## 2021-01-04 DIAGNOSIS — N83202 Unspecified ovarian cyst, left side: Secondary | ICD-10-CM | POA: Diagnosis not present

## 2021-01-04 DIAGNOSIS — K3189 Other diseases of stomach and duodenum: Secondary | ICD-10-CM | POA: Diagnosis not present

## 2021-01-04 DIAGNOSIS — M810 Age-related osteoporosis without current pathological fracture: Secondary | ICD-10-CM | POA: Diagnosis not present

## 2021-01-04 DIAGNOSIS — G309 Alzheimer's disease, unspecified: Secondary | ICD-10-CM | POA: Diagnosis not present

## 2021-01-04 DIAGNOSIS — K5900 Constipation, unspecified: Secondary | ICD-10-CM | POA: Diagnosis not present

## 2021-01-04 DIAGNOSIS — N838 Other noninflammatory disorders of ovary, fallopian tube and broad ligament: Secondary | ICD-10-CM | POA: Diagnosis not present

## 2021-01-04 DIAGNOSIS — K319 Disease of stomach and duodenum, unspecified: Secondary | ICD-10-CM | POA: Diagnosis not present

## 2021-01-04 DIAGNOSIS — K269 Duodenal ulcer, unspecified as acute or chronic, without hemorrhage or perforation: Secondary | ICD-10-CM | POA: Diagnosis not present

## 2021-01-04 DIAGNOSIS — I10 Essential (primary) hypertension: Secondary | ICD-10-CM | POA: Diagnosis not present

## 2021-01-04 DIAGNOSIS — I700 Atherosclerosis of aorta: Secondary | ICD-10-CM | POA: Diagnosis not present

## 2021-01-04 DIAGNOSIS — F0280 Dementia in other diseases classified elsewhere without behavioral disturbance: Secondary | ICD-10-CM | POA: Diagnosis not present

## 2021-01-04 DIAGNOSIS — S2241XA Multiple fractures of ribs, right side, initial encounter for closed fracture: Secondary | ICD-10-CM | POA: Diagnosis not present

## 2021-01-04 DIAGNOSIS — E079 Disorder of thyroid, unspecified: Secondary | ICD-10-CM | POA: Diagnosis not present

## 2021-01-04 DIAGNOSIS — R935 Abnormal findings on diagnostic imaging of other abdominal regions, including retroperitoneum: Secondary | ICD-10-CM | POA: Diagnosis not present

## 2021-01-06 DIAGNOSIS — G309 Alzheimer's disease, unspecified: Secondary | ICD-10-CM | POA: Diagnosis not present

## 2021-01-06 DIAGNOSIS — Z90710 Acquired absence of both cervix and uterus: Secondary | ICD-10-CM | POA: Diagnosis not present

## 2021-01-06 DIAGNOSIS — N83202 Unspecified ovarian cyst, left side: Secondary | ICD-10-CM | POA: Diagnosis not present

## 2021-01-06 DIAGNOSIS — Z299 Encounter for prophylactic measures, unspecified: Secondary | ICD-10-CM | POA: Diagnosis not present

## 2021-01-06 DIAGNOSIS — K259 Gastric ulcer, unspecified as acute or chronic, without hemorrhage or perforation: Secondary | ICD-10-CM | POA: Diagnosis not present

## 2021-01-06 DIAGNOSIS — N949 Unspecified condition associated with female genital organs and menstrual cycle: Secondary | ICD-10-CM | POA: Diagnosis not present

## 2021-01-06 DIAGNOSIS — I10 Essential (primary) hypertension: Secondary | ICD-10-CM | POA: Diagnosis not present

## 2021-01-09 ENCOUNTER — Encounter: Payer: Self-pay | Admitting: Internal Medicine

## 2021-01-14 ENCOUNTER — Encounter: Payer: Self-pay | Admitting: Physician Assistant

## 2021-02-03 DIAGNOSIS — I10 Essential (primary) hypertension: Secondary | ICD-10-CM | POA: Diagnosis not present

## 2021-02-04 ENCOUNTER — Encounter: Payer: Self-pay | Admitting: Physician Assistant

## 2021-02-04 ENCOUNTER — Ambulatory Visit (INDEPENDENT_AMBULATORY_CARE_PROVIDER_SITE_OTHER): Payer: Medicare Other | Admitting: Physician Assistant

## 2021-02-04 VITALS — BP 100/54 | HR 82 | Ht 64.0 in

## 2021-02-04 DIAGNOSIS — K219 Gastro-esophageal reflux disease without esophagitis: Secondary | ICD-10-CM

## 2021-02-04 DIAGNOSIS — F0280 Dementia in other diseases classified elsewhere without behavioral disturbance: Secondary | ICD-10-CM

## 2021-02-04 DIAGNOSIS — G309 Alzheimer's disease, unspecified: Secondary | ICD-10-CM

## 2021-02-04 DIAGNOSIS — K5900 Constipation, unspecified: Secondary | ICD-10-CM

## 2021-02-04 DIAGNOSIS — R9389 Abnormal findings on diagnostic imaging of other specified body structures: Secondary | ICD-10-CM

## 2021-02-04 DIAGNOSIS — R935 Abnormal findings on diagnostic imaging of other abdominal regions, including retroperitoneum: Secondary | ICD-10-CM | POA: Diagnosis not present

## 2021-02-04 MED ORDER — OMEPRAZOLE 40 MG PO CPDR: 40.0000 mg | DELAYED_RELEASE_CAPSULE | Freq: Two times a day (BID) | ORAL | 3 refills | Status: AC

## 2021-02-04 NOTE — Progress Notes (Signed)
Chief Complaint: "Gastric ulcer"  HPI:    Monica Herman is an 80 year old female with past medical history as listed below including Alzheimer's disease, who was referred to me by Monico Blitz, MD for a complaint of "gastric ulcer".      02/17/2016 echo with an LVEF 64% and normal aortic valve.    01/04/2021 patient seen in the ED in [place] for "duodenal disease" and constipation.  At that time patient described longstanding constipation issues had been using enemas without results.  At that time had a KUB with concern for "mass" in the abdomen with results of mixed density finding in the pelvis which may reflect a calcified mass versus radiodense material within the bowel lumen.  Age-indeterminate fractures of the right ninth and 10th ribs.  No bowel obstruction.  And a CT with oral contrast only was ordered due to creatinine 1.69.  Potassium was low at 2.7.  Sodium low 134. CBC with a white count of 12.  CT showed circumferential wall thickening involving the proximal duodenum, suspicious for peptic ulcer disease although neoplasm cannot be excluded.  Consider upper endoscopy for further evaluation.  Also 4.1 cm left adnexal simple appearing cyst.  Pelvic ultrasound recommended.    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  [facility] referral for further work-up recommended.  Also dedicated pelvic MRI with and without contrast.    Today, patient presents to clinic accompanied by her husband as well as her daughter who both assist with her care.  They are really quite unaware of why they were sent here but do know that they were sent after being seen in the ER.  Tell me that at the time of ER visit on 10/1 patient was experiencing some abdominal pain and had been constipated which is quite chronic for her.  Apparently she has had problems her whole life off and on and does not take anything for this.  She will  typically have a bowel movement once every 3 to 4 days or so.  Was also having some issues with reflux when they were seen in the ER but this seems better.  She is chronically on Omeprazole, they are unsure of what dose daily.  Associated symptoms include about a 20 pound weight loss over the past year.    She and her husband were together for about 16 years, then they were divorced for about 40+ and they have been back and remarried for the past 6 years.    Denies fever, chills, ongoing abdominal pain or reflux symptoms.  Past Medical History:  Diagnosis Date   Alzheimer disease    Hypertension    Osteoporosis    Thyroid disease    Unsteady gait     Past Surgical History:  Procedure Laterality Date   ABDOMINAL HYSTERECTOMY     CHOLECYSTECTOMY     COLONOSCOPY     FRACTURE SURGERY      Current Outpatient Medications  Medication Sig Dispense Refill   alendronate (FOSAMAX) 70 MG tablet Take 1 tablet by mouth every Wednesday.  12   aspirin EC 81 MG tablet Take 81 mg by mouth every morning.     atenolol (TENORMIN) 50 MG tablet Take 1 tablet (50 mg total) by mouth daily. 60 tablet 0   citalopram (CELEXA) 20 MG tablet Take 20 mg by mouth daily.  2   clonazePAM (KLONOPIN) 0.5 MG tablet Take 1 tablet by mouth  2 (two) times daily.  2   levothyroxine (SYNTHROID, LEVOTHROID) 75 MCG tablet Take 37.5 mcg by mouth daily.  3   Memantine HCl-Donepezil HCl (NAMZARIC) 28-10 MG CP24 Take 1 tablet by mouth daily.      oxyCODONE-acetaminophen (PERCOCET/ROXICET) 5-325 MG tablet Take 1 tablet by mouth 3 (three) times daily as needed. For pain  0   potassium chloride (K-DUR) 10 MEQ tablet Take 10 mEq by mouth every morning.  2   No current facility-administered medications for this visit.    Allergies as of 02/04/2021   (No Known Allergies)    Family History  Problem Relation Age of Onset   Cancer Mother    Cancer Father    Heart attack Sister     Social History   Socioeconomic History    Marital status: Married    Spouse name: Not on file   Number of children: Not on file   Years of education: Not on file   Highest education level: Not on file  Occupational History   Not on file  Tobacco Use   Smoking status: Never   Smokeless tobacco: Never  Substance and Sexual Activity   Alcohol use: No   Drug use: No   Sexual activity: Not on file  Other Topics Concern   Not on file  Social History Narrative   Not on file   Social Determinants of Health   Financial Resource Strain: Not on file  Food Insecurity: Not on file  Transportation Needs: Not on file  Physical Activity: Not on file  Stress: Not on file  Social Connections: Not on file  Intimate Partner Violence: Not on file    Review of Systems:    Constitutional: No weight loss, fever or chills Skin: No rash  Cardiovascular: No chest pain  Respiratory: No SOB Gastrointestinal: See HPI and otherwise negative Genitourinary: No dysuria  Neurological: No headache, dizziness or syncope Musculoskeletal: No new muscle or joint pain Hematologic: No bleeding Psychiatric: No history of depression or anxiety   Physical Exam:  Vital signs: BP (!) 100/54   Pulse 82   Ht 5\' 4"  (1.626 m)   BMI 21.80 kg/m    Constitutional:   Pleasantly demented elderly Caucasian female appears to be in NAD, Well developed, Well nourished, alert and cooperative Head:  Normocephalic and atraumatic. Eyes:   PEERL, EOMI. No icterus. Conjunctiva pink. Ears:  Normal auditory acuity. Neck:  Supple Throat: Oral cavity and pharynx without inflammation, swelling or lesion.  Respiratory: Respirations even and unlabored. Lungs clear to auscultation bilaterally.   No wheezes, crackles, or rhonchi.  Cardiovascular: Normal S1, S2. No MRG. Regular rate and rhythm. No peripheral edema, cyanosis or pallor.  Gastrointestinal:  Soft, nondistended, some mild bilateral upper abdominal TTP. No rebound or guarding. Normal bowel sounds. No appreciable  masses or hepatomegaly. Rectal:  Not performed.  Msk:  Symmetrical without gross deformities. Without edema, no deformity or joint abnormality.  In wheelchair but able to walk with assistance Neurologic:  Alert and  oriented x3;  grossly normal neurologically.  Skin:   Dry and intact without significant lesions or rashes. Psychiatric:  Demonstrates good judgement and reason without abnormal affect or behaviors.+ Alzheimer's  See HPI for recent labs and imaging.  Assessment: 1.  Abnormal CT of the abdomen: Done at recent ER visit for constipation after an x-ray showed possible "mass" in the lower abdomen, CT then showed masslike area in the duodenum and large cyst in ovary, they were unable to  define if this was ulcer or other, does have history of reflux, has been losing weight about 20 pounds in the past year (could be related to Alzheimer's and age); consider ulcer versus neoplasm versus inflammation versus other 2.  GERD: Typically controlled on Omeprazole, unsure what dose 3.  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 Alzheimers  Plan: 1.  Discussed options with the patient and her family today.  Explained that at her age of 60 with Alzheimer's she is at increased risk for any anesthetic procedures including EGD as this could worsen her Alzheimer's and also comes with other risks of bleeding, etc.  After discussion of all these risks family wants to proceed with EGD for further evaluation of abnormal CT.  Patient was also provided with a paper handout regarding other risks for the procedure. 2.  Scheduled patient for an EGD with Dr. Rush Landmark in the [facility].  She will continue to follow with Dr. Rush Landmark as her primary GI physician after time of procedure. 3.  Went ahead and increased her Omeprazole to 40  mg twice daily to treat for any possible ulcer. 4.  Recommend the patient use MiraLAX on a daily basis.  Discussed titration of this up to 4 times a day as needed. 5.  Did discuss with patient and her family that they need to call her gynecologist and/or get a referral from her PCP in regards to abnormal pelvic ultrasound.  They were unaware that they were supposed to do this after recent pelvic ultrasound. 6.  Patient to follow in clinic per recommendations from Dr. Rush Landmark after time of procedure.  Monica Newer, PA-C [facility] 02/04/2021, 3:26 PM  Cc: Monico Blitz, MD

## 2021-02-04 NOTE — Patient Instructions (Addendum)
Start Miralax 1 capful daily in 8 ounces of liquid.  We have sent the following medications to your pharmacy for you to pick up at your convenience: Omeprazole 40 mg twice daily 30-60 minutes before breakfast and dinner.   Follow up with GYN about abnormal ultrasound.  You have been scheduled for an endoscopy. Please follow written instructions given to you at your visit today. If you use inhalers (even only as needed), please bring them with you on the day of your procedure.  If you are age 11 or older, your body mass index should be between 23-30. Your Body mass index is 21.8 kg/m. If this is out of the aforementioned range listed, please consider follow up with your Primary Care Provider.  _______________________________________________________  The [facility] providers would like to encourage you to use [facility] to communicate with providers for non-urgent requests or questions.  Due to long hold times on the telephone, sending your provider a message by [facility] may be a faster and more efficient way to get a response.  Please allow 48 business hours for a response.  Please remember that this is for non-urgent requests.  _______________________________________________________

## 2021-02-05 NOTE — Progress Notes (Signed)
Attending Physician's Attestation   I have reviewed the chart.   I agree with the Advanced Practitioner's note, impression, and recommendations with any updates as below.    Keasia Dubose Mansouraty, MD [facility] Advanced Endoscopy Office # [phone]  

## 2021-02-18 ENCOUNTER — Ambulatory Visit (AMBULATORY_SURGERY_CENTER): Payer: Medicare Other | Admitting: Gastroenterology

## 2021-02-18 ENCOUNTER — Other Ambulatory Visit: Payer: Self-pay

## 2021-02-18 ENCOUNTER — Other Ambulatory Visit (INDEPENDENT_AMBULATORY_CARE_PROVIDER_SITE_OTHER): Payer: Medicare Other

## 2021-02-18 ENCOUNTER — Encounter: Payer: Self-pay | Admitting: Gastroenterology

## 2021-02-18 ENCOUNTER — Telehealth: Payer: Self-pay

## 2021-02-18 VITALS — BP 132/58 | HR 75 | Temp 94.2°F | Resp 13 | Ht 64.0 in | Wt 130.0 lb

## 2021-02-18 DIAGNOSIS — R933 Abnormal findings on diagnostic imaging of other parts of digestive tract: Secondary | ICD-10-CM | POA: Diagnosis not present

## 2021-02-18 DIAGNOSIS — K259 Gastric ulcer, unspecified as acute or chronic, without hemorrhage or perforation: Secondary | ICD-10-CM | POA: Diagnosis not present

## 2021-02-18 DIAGNOSIS — K449 Diaphragmatic hernia without obstruction or gangrene: Secondary | ICD-10-CM | POA: Diagnosis not present

## 2021-02-18 DIAGNOSIS — R935 Abnormal findings on diagnostic imaging of other abdominal regions, including retroperitoneum: Secondary | ICD-10-CM

## 2021-02-18 DIAGNOSIS — K269 Duodenal ulcer, unspecified as acute or chronic, without hemorrhage or perforation: Secondary | ICD-10-CM

## 2021-02-18 DIAGNOSIS — D132 Benign neoplasm of duodenum: Secondary | ICD-10-CM

## 2021-02-18 DIAGNOSIS — K219 Gastro-esophageal reflux disease without esophagitis: Secondary | ICD-10-CM | POA: Diagnosis not present

## 2021-02-18 DIAGNOSIS — K2980 Duodenitis without bleeding: Secondary | ICD-10-CM

## 2021-02-18 DIAGNOSIS — K2970 Gastritis, unspecified, without bleeding: Secondary | ICD-10-CM | POA: Diagnosis not present

## 2021-02-18 LAB — BASIC METABOLIC PANEL
BUN: 41 mg/dL — ABNORMAL HIGH (ref 6–23)
CO2: 24 mEq/L (ref 19–32)
Calcium: 8.6 mg/dL (ref 8.4–10.5)
Chloride: 102 mEq/L (ref 96–112)
Creatinine, Ser: 2.34 mg/dL — ABNORMAL HIGH (ref 0.40–1.20)
GFR: 19.1 mL/min — ABNORMAL LOW (ref >60.00)
Glucose, Bld: 113 mg/dL — ABNORMAL HIGH (ref 70–99)
Potassium: 4.7 mEq/L (ref 3.5–5.1)
Sodium: 134 mEq/L — ABNORMAL LOW (ref 135–145)

## 2021-02-18 MED ORDER — SODIUM CHLORIDE 0.9 % IV SOLN: 500.0000 mL | Freq: Once | INTRAVENOUS | Status: DC

## 2021-02-18 MED ORDER — SUCRALFATE 1 GM/10ML PO SUSP: 1.0000 g | Freq: Three times a day (TID) | ORAL | 1 refills | Status: AC

## 2021-02-18 NOTE — Progress Notes (Signed)
Vitals-DT  History reviewed. 

## 2021-02-18 NOTE — Progress Notes (Signed)
Pt Drowsy. VSS. To PACU, report to RN. No anesthetic complications noted.  

## 2021-02-18 NOTE — Patient Instructions (Addendum)
Results for DREW, HERMAN (MRN [id number]) as of 02/18/2021 13:56  08/18/2015 06:15 02/18/2021 23:55  BASIC METABOLIC PANEL Rpt (A) Rpt (A)  Sodium 135 134 (L)  Potassium 3.9 4.7  Chloride 108 102  CO2 23 24  Glucose 80 113 (H)  BUN 20 41 (H)  Creatinine 1.35 (H) 2.34 (H)  Calcium 8.5 (L) 8.6  Anion gap 4 (L)   GFR  19.10 (L)  GFR, Est Non African American 37 (L)   GFR, Est African American 43 (L)     Full Liquid Diet  Continue omeprazole 40mg  twice a day  Start carafate 4 times a day before meals and at night May continue low dose aspirin, but minimize other NSAIDs including Meloxicam  Follow up with PCP  YOU HAD AN ENDOSCOPIC PROCEDURE TODAY AT [place]:   Refer to the procedure report that was given to you for any specific questions about what was found during the examination.  If the procedure report does not answer your questions, please call your gastroenterologist to clarify.  If you requested that your care partner not be given the details of your procedure findings, then the procedure report has been included in a sealed envelope for you to review at your convenience later.  YOU SHOULD EXPECT: Some feelings of bloating in the abdomen. Passage of more gas than usual.  Walking can help get rid of the air that was put into your GI tract during the procedure and reduce the bloating. If you had a lower endoscopy (such as a colonoscopy or flexible sigmoidoscopy) you may notice spotting of blood in your stool or on the toilet paper. If you underwent a bowel prep for your procedure, you may not have a normal bowel movement for a few days.  Please Note:  You might notice some irritation and congestion in your nose or some drainage.  This is from the oxygen used during your procedure.  There is no need for concern and it should clear up in a day or so.  SYMPTOMS TO REPORT IMMEDIATELY:  Following upper endoscopy (EGD)  Vomiting of blood or coffee ground material  New  chest pain or pain under the shoulder blades  Painful or persistently difficult swallowing  New shortness of breath  Fever of 100F or higher  Black, tarry-looking stools  For urgent or emergent issues, a gastroenterologist can be reached at any hour by calling [phone]. Do not use MyChart messaging for urgent concerns.    DIET:  Full liquid diet.  Drink plenty of fluids but you should avoid alcoholic beverages for 24 hours.  ACTIVITY:  You should plan to take it easy for the rest of today and you should NOT DRIVE or use heavy machinery until tomorrow (because of the sedation medicines used during the test).    FOLLOW UP: Our staff will call the number listed on your records 48-72 hours following your procedure to check on you and address any questions or concerns that you may have regarding the information given to you following your procedure. If we do not reach you, we will leave a message.  We will attempt to reach you two times.  During this call, we will ask if you have developed any symptoms of COVID 19. If you develop any symptoms (ie: fever, flu-like symptoms, shortness of breath, cough etc.) before then, please call [phone].  If you test positive for Covid 19 in the 2 weeks post procedure, please call and report this  information to [place].    If any biopsies were taken you will be contacted by phone or by letter within the next 1-3 weeks.  Please call us at [phone] if you have not heard about the biopsies in 3 weeks.    SIGNATURES/CONFIDENTIALITY: You and/or your care partner have signed paperwork which will be entered into your electronic medical record.  These signatures attest to the fact that that the information above on your After Visit Summary has been reviewed and is understood.  Full responsibility of the confidentiality of this discharge information lies with you and/or your care-partner.

## 2021-02-18 NOTE — Telephone Encounter (Signed)
I spoke with the pt's daughter and she states that they are on the way now to have the labs.  The order has been entered.

## 2021-02-18 NOTE — Progress Notes (Signed)
Called to room to assist during endoscopic procedure.  Patient ID and intended procedure confirmed with present staff. Received instructions for my participation in the procedure from the performing physician.  

## 2021-02-18 NOTE — Progress Notes (Signed)
Recall placed for 3 month repeat EGD # [id number]

## 2021-02-18 NOTE — Telephone Encounter (Signed)
-----   Message from Irving Copas., MD sent at 02/17/2021  5:01 PM EST ----- Regarding: Upcoming EGD Monica Herman, Please reach out to this patient on 11/15 a.m. She needs to come in early and have stat BMP performed because we need those labs before her EGD can be completed since she had such a low potassium level at that time. If her potassium is too low, it will be canceled by anesthesia from a safety standpoint. Thanks. GM

## 2021-02-18 NOTE — Progress Notes (Signed)
GASTROENTEROLOGY PROCEDURE H&P NOTE   Primary Care Physician: Monico Blitz, MD  HPI: Monica Herman is a 80 y.o. female who presents for EGD for evaluation of   Past Medical History:  Diagnosis Date   Alzheimer disease ([place])    Hypertension    Osteoporosis    Thyroid disease    Unsteady gait    Past Surgical History:  Procedure Laterality Date   ABDOMINAL HYSTERECTOMY     CHOLECYSTECTOMY     COLONOSCOPY     FRACTURE SURGERY     Current Outpatient Medications  Medication Sig Dispense Refill   alendronate (FOSAMAX) 70 MG tablet Take 1 tablet by mouth every Wednesday.  12   aspirin EC 81 MG tablet Take 81 mg by mouth every morning.     atenolol (TENORMIN) 50 MG tablet Take 1 tablet (50 mg total) by mouth daily. 60 tablet 0   citalopram (CELEXA) 20 MG tablet Take 20 mg by mouth daily.  2   clonazePAM (KLONOPIN) 0.5 MG tablet Take 1 tablet by mouth 2 (two) times daily.  2   levothyroxine (SYNTHROID, LEVOTHROID) 75 MCG tablet Take 37.5 mcg by mouth daily.  3   Memantine HCl-Donepezil HCl 28-10 MG CP24 Take 1 tablet by mouth daily.      omeprazole (PRILOSEC) 40 MG capsule Take 1 capsule (40 mg total) by mouth 2 (two) times daily before a meal. 60 capsule 3   oxyCODONE-acetaminophen (PERCOCET/ROXICET) 5-325 MG tablet Take 1 tablet by mouth 3 (three) times daily as needed. For pain  0   potassium chloride (K-DUR) 10 MEQ tablet Take 10 mEq by mouth every morning.  2   No current facility-administered medications for this visit.    Current Outpatient Medications:    alendronate (FOSAMAX) 70 MG tablet, Take 1 tablet by mouth every Wednesday., Disp: , Rfl: 12   aspirin EC 81 MG tablet, Take 81 mg by mouth every morning., Disp: , Rfl:    atenolol (TENORMIN) 50 MG tablet, Take 1 tablet (50 mg total) by mouth daily., Disp: 60 tablet, Rfl: 0   citalopram (CELEXA) 20 MG tablet, Take 20 mg by mouth daily., Disp: , Rfl: 2   clonazePAM (KLONOPIN) 0.5 MG tablet, Take 1 tablet by mouth 2 (two)  times daily., Disp: , Rfl: 2   levothyroxine (SYNTHROID, LEVOTHROID) 75 MCG tablet, Take 37.5 mcg by mouth daily., Disp: , Rfl: 3   Memantine HCl-Donepezil HCl 28-10 MG CP24, Take 1 tablet by mouth daily. , Disp: , Rfl:    omeprazole (PRILOSEC) 40 MG capsule, Take 1 capsule (40 mg total) by mouth 2 (two) times daily before a meal., Disp: 60 capsule, Rfl: 3   oxyCODONE-acetaminophen (PERCOCET/ROXICET) 5-325 MG tablet, Take 1 tablet by mouth 3 (three) times daily as needed. For pain, Disp: , Rfl: 0   potassium chloride (K-DUR) 10 MEQ tablet, Take 10 mEq by mouth every morning., Disp: , Rfl: 2 No Known Allergies Family History  Problem Relation Age of Onset   Cancer Mother    Cancer Father    Heart attack Sister    Social History   Socioeconomic History   Marital status: Married    Spouse name: Not on file   Number of children: 1   Years of education: Not on file   Highest education level: Not on file  Occupational History   Not on file  Tobacco Use   Smoking status: Never   Smokeless tobacco: Never  Substance and Sexual Activity  Alcohol use: No   Drug use: No   Sexual activity: Not on file  Other Topics Concern   Not on file  Social History Narrative   Not on file   Social Determinants of Health   Financial Resource Strain: Not on file  Food Insecurity: Not on file  Transportation Needs: Not on file  Physical Activity: Not on file  Stress: Not on file  Social Connections: Not on file  Intimate Partner Violence: Not on file    Physical Exam: There were no vitals filed for this visit. There is no height or weight on file to calculate BMI. GEN: NAD EYE: Sclerae anicteric ENT: MMM CV: Non-tachycardic GI: Soft, NT/ND NEURO:  Alert & Oriented x 3  Lab Results: No results for input(s): WBC, HGB, HCT, PLT in the last 72 hours. BMET Recent Labs    02/18/21 1131  NA 134*  K 4.7  CL 102  CO2 24  GLUCOSE 113*  BUN 41*  CREATININE 2.34*  CALCIUM 8.6   LFT No  results for input(s): PROT, ALBUMIN, AST, ALT, ALKPHOS, BILITOT, BILIDIR, IBILI in the last 72 hours. PT/INR No results for input(s): LABPROT, INR in the last 72 hours.   Impression / Plan: This is a 80 y.o.female who presents for   The risks and benefits of endoscopic evaluation/treatment were discussed with the patient and/or family; these include but are not limited to the risk of perforation, infection, bleeding, missed lesions, lack of diagnosis, severe illness requiring hospitalization, as well as anesthesia and sedation related illnesses.  The patient's history has been reviewed, patient examined, no change in status, and deemed stable for procedure.  The patient and/or family is agreeable to proceed.    Justice Britain, MD [facility] Advanced Endoscopy Office # [phone]

## 2021-02-18 NOTE — Op Note (Signed)
[place] Patient Name: Monica Herman Procedure Date: 02/18/2021 1:32 PM MRN: [id number] Endoscopist: Justice Britain , MD Age: 80 Referring MD:  Date of Birth: [birth year] Gender: Female Account #: [id number] Procedure:                Upper GI endoscopy Indications:              Generalized abdominal pain, Exclusion of peptic                            ulcer, Abnormal CT of the GI tract, Weight loss Medicines:                Monitored Anesthesia Care Procedure:                Pre-Anesthesia Assessment:                           - Prior to the procedure, a History and Physical                            was performed, and patient medications and                            allergies were reviewed. The patient's tolerance of                            previous anesthesia was also reviewed. The risks                            and benefits of the procedure and the sedation                            options and risks were discussed with the patient.                            All questions were answered, and informed consent                            was obtained. Prior Anticoagulants: The patient has                            taken no previous anticoagulant or antiplatelet                            agents except for NSAID medication. ASA Grade                            Assessment: III - A patient with severe systemic                            disease. After reviewing the risks and benefits,                            the patient was deemed in satisfactory condition to  undergo the procedure.                           After obtaining informed consent, the endoscope was                            passed under direct vision. Throughout the                            procedure, the patient's blood pressure, pulse, and                            oxygen saturations were monitored continuously. The                            #4314 Olympus Endoscope  was introduced through the                            mouth, and advanced to the second part of duodenum.                            The upper GI endoscopy was accomplished without                            difficulty. The patient tolerated the procedure. Scope In: Scope Out: Findings:                 Tortuous esophagus but no other gross lesions were                            noted in the entire esophagus.                           The Z-line was regular and was found 35 cm from the                            incisors.                           A 2 cm hiatal hernia was present.                           Segmental severe inflammation with hemorrhage                            characterized by erosions, erythema, granularity,                            linear erosions and shallow ulcerations was found                            in the entire examined stomach. Biopsies were taken                            with a cold forceps for histology and Helicobacter  pylori testing.                           A deformity was found in the distal gastric antrum                            - almost suggestive of previously healed ulcer but                            nothing present at that area per sea noted today.                           One non-bleeding cratered duodenal ulcer with a                            clean ulcer base (Forrest Class III) was found at                            the apex of the duodenal bulb extending into the                            duodenal sweep of D1/D2. The ulcer was 20 mm in                            largest dimension. Biopsies were taken with a cold                            forceps for histology.                           The ulcer and congested mucosa led to an acquired                            benign-appearing, intrinsic moderate stenosis at                            the duodenal sweep and was traversed with gentle                             movement of the scope into D2.                           No other gross lesions were noted in the duodenal                            bulb, in the visualized portions of first portion                            of the duodenum and in the second portion of the                            duodenum. Biopsies were taken with a cold forceps  for histology. Complications:            No immediate complications. Estimated Blood Loss:     Estimated blood loss was minimal. Impression:               - Tortuous esophagus but no gross mucosal lesions                            in esophagus. Z-line regular, 35 cm from the                            incisors.                           - 2 cm hiatal hernia.                           - Gastritis with hemorrhage. Biopsied.                           - A deformity in the gastric antrum was noted.                           - Non-bleeding duodenal ulcer with a clean ulcer                            base (Forrest Class III) - biopsied found at the                            apex of bulb extending into the sweep. 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                           - No other gross lesions in the duodenal bulb, in                            the first portion of the duodenum and in the second                            portion of the duodenum. Biopsied. Recommendation:           - The patient will be observed post-procedure,                            until all discharge criteria are met.                           - Discharge patient to home.                           - Patient has  a contact number available for                            emergencies. The signs and symptoms of potential                            delayed complications were discussed with the                            patient. Return to normal activities  tomorrow.                            Written discharge instructions were provided to the                            patient.                           - Full liquid diet.                           - Continue Omeprazole 40 mg twice daily.                           - Start Carafate QAC + QHS (try to not take any                            medications for at least 1 hour before/after to                            decrease issues of absorption of those                            medications). If unable to get the liquid then                            please let us know so we can let you know how to                            make Carafate slurry from tablet.                           - Observe patient's clinical course.                           - Await pathology results.                           - May continue Aspirin. Minimize any other                            non-steroidals (Meloxicam).                           - Repeat upper endoscopy in 3 months to check  healing.                           - Patient needs to follow up with PCP as well since                            her Creatinine has increased and needs monitoring                            closely.                           - The findings and recommendations were discussed                            with the patient.                           - The findings and recommendations were discussed                            with the patient's family. Justice Britain, MD 02/18/2021 2:01:40 PM

## 2021-02-18 NOTE — Telephone Encounter (Signed)
Thanks for update. I'll look for the labs. GM

## 2021-02-20 ENCOUNTER — Telehealth: Payer: Self-pay | Admitting: *Deleted

## 2021-02-20 ENCOUNTER — Telehealth: Payer: Self-pay

## 2021-02-20 NOTE — Telephone Encounter (Signed)
No answer, left msg

## 2021-02-20 NOTE — Telephone Encounter (Signed)
  Follow up Call-  Call back number 02/18/2021  Post procedure Call Back phone  # [phone]- husband/can''t hear well  Permission to leave phone message Yes  Some recent data might be hidden    spoke with husband Patient questions:  Do you have a fever, pain , or abdominal swelling? No. Pain Score  0 *  Have you tolerated food without any problems? Yes.    Have you been able to return to your normal activities? Yes.    Do you have any questions about your discharge instructions: Diet   No. Medications  No. Follow up visit  No.  Do you have questions or concerns about your Care? No.  Actions: * If pain score is 4 or above: No action needed, pain <4.  Have you developed a fever since your procedure? no  2.   Have you had an respiratory symptoms (SOB or cough) since your procedure? no  3.   Have you tested positive for COVID 19 since your procedure no  4.   Have you had any family members/close contacts diagnosed with the COVID 19 since your procedure?  no   If yes to any of these questions please route to Joylene John, RN and Joella Prince, RN

## 2021-02-25 ENCOUNTER — Encounter: Payer: Self-pay | Admitting: Gastroenterology

## 2021-02-25 DIAGNOSIS — Z6821 Body mass index (BMI) 21.0-21.9, adult: Secondary | ICD-10-CM | POA: Diagnosis not present

## 2021-02-25 DIAGNOSIS — N9489 Other specified conditions associated with female genital organs and menstrual cycle: Secondary | ICD-10-CM | POA: Diagnosis not present

## 2021-02-25 DIAGNOSIS — C574 Malignant neoplasm of uterine adnexa, unspecified: Secondary | ICD-10-CM | POA: Diagnosis not present

## 2021-03-05 DIAGNOSIS — I10 Essential (primary) hypertension: Secondary | ICD-10-CM | POA: Diagnosis not present

## 2021-03-21 DIAGNOSIS — E039 Hypothyroidism, unspecified: Secondary | ICD-10-CM | POA: Diagnosis not present

## 2021-03-21 DIAGNOSIS — F0280 Dementia in other diseases classified elsewhere without behavioral disturbance: Secondary | ICD-10-CM | POA: Diagnosis not present

## 2021-03-21 DIAGNOSIS — I10 Essential (primary) hypertension: Secondary | ICD-10-CM | POA: Diagnosis not present

## 2021-03-21 DIAGNOSIS — G309 Alzheimer's disease, unspecified: Secondary | ICD-10-CM | POA: Diagnosis not present

## 2021-03-21 DIAGNOSIS — Z299 Encounter for prophylactic measures, unspecified: Secondary | ICD-10-CM | POA: Diagnosis not present

## 2021-04-14 DIAGNOSIS — Z299 Encounter for prophylactic measures, unspecified: Secondary | ICD-10-CM | POA: Diagnosis not present

## 2021-04-14 DIAGNOSIS — Z789 Other specified health status: Secondary | ICD-10-CM | POA: Diagnosis not present

## 2021-04-14 DIAGNOSIS — E039 Hypothyroidism, unspecified: Secondary | ICD-10-CM | POA: Diagnosis not present

## 2021-04-14 DIAGNOSIS — G309 Alzheimer's disease, unspecified: Secondary | ICD-10-CM | POA: Diagnosis not present

## 2021-04-14 DIAGNOSIS — Z6820 Body mass index (BMI) 20.0-20.9, adult: Secondary | ICD-10-CM | POA: Diagnosis not present

## 2021-04-14 DIAGNOSIS — F0280 Dementia in other diseases classified elsewhere without behavioral disturbance: Secondary | ICD-10-CM | POA: Diagnosis not present

## 2021-04-14 DIAGNOSIS — I10 Essential (primary) hypertension: Secondary | ICD-10-CM | POA: Diagnosis not present

## 2021-04-29 DIAGNOSIS — Z66 Do not resuscitate: Secondary | ICD-10-CM | POA: Diagnosis not present

## 2021-04-29 DIAGNOSIS — M810 Age-related osteoporosis without current pathological fracture: Secondary | ICD-10-CM | POA: Diagnosis not present

## 2021-04-29 DIAGNOSIS — F0280 Dementia in other diseases classified elsewhere without behavioral disturbance: Secondary | ICD-10-CM | POA: Diagnosis not present

## 2021-04-29 DIAGNOSIS — A419 Sepsis, unspecified organism: Secondary | ICD-10-CM | POA: Diagnosis not present

## 2021-04-29 DIAGNOSIS — E079 Disorder of thyroid, unspecified: Secondary | ICD-10-CM | POA: Diagnosis not present

## 2021-04-29 DIAGNOSIS — Z79890 Hormone replacement therapy (postmenopausal): Secondary | ICD-10-CM | POA: Diagnosis not present

## 2021-04-29 DIAGNOSIS — R6521 Severe sepsis with septic shock: Secondary | ICD-10-CM | POA: Diagnosis not present

## 2021-04-29 DIAGNOSIS — R4020 Unspecified coma: Secondary | ICD-10-CM | POA: Diagnosis not present

## 2021-04-29 DIAGNOSIS — E872 Acidosis, unspecified: Secondary | ICD-10-CM | POA: Diagnosis not present

## 2021-04-29 DIAGNOSIS — Z20822 Contact with and (suspected) exposure to covid-19: Secondary | ICD-10-CM | POA: Diagnosis not present

## 2021-04-29 DIAGNOSIS — R059 Cough, unspecified: Secondary | ICD-10-CM | POA: Diagnosis not present

## 2021-04-29 DIAGNOSIS — R4182 Altered mental status, unspecified: Secondary | ICD-10-CM | POA: Diagnosis not present

## 2021-04-29 DIAGNOSIS — I10 Essential (primary) hypertension: Secondary | ICD-10-CM | POA: Diagnosis not present

## 2021-04-29 DIAGNOSIS — G309 Alzheimer's disease, unspecified: Secondary | ICD-10-CM | POA: Diagnosis not present

## 2021-04-29 DIAGNOSIS — N3001 Acute cystitis with hematuria: Secondary | ICD-10-CM | POA: Diagnosis not present

## 2021-04-29 DIAGNOSIS — Z79899 Other long term (current) drug therapy: Secondary | ICD-10-CM | POA: Diagnosis not present

## 2021-04-29 DIAGNOSIS — K922 Gastrointestinal hemorrhage, unspecified: Secondary | ICD-10-CM | POA: Diagnosis not present

## 2021-04-29 DIAGNOSIS — J9600 Acute respiratory failure, unspecified whether with hypoxia or hypercapnia: Secondary | ICD-10-CM | POA: Diagnosis not present

## 2021-04-30 DIAGNOSIS — Z95810 Presence of automatic (implantable) cardiac defibrillator: Secondary | ICD-10-CM | POA: Diagnosis not present

## 2021-04-30 DIAGNOSIS — R4182 Altered mental status, unspecified: Secondary | ICD-10-CM | POA: Diagnosis not present

## 2021-04-30 DIAGNOSIS — Z452 Encounter for adjustment and management of vascular access device: Secondary | ICD-10-CM | POA: Diagnosis not present

## 2021-04-30 DIAGNOSIS — R109 Unspecified abdominal pain: Secondary | ICD-10-CM | POA: Diagnosis not present

## 2021-04-30 DIAGNOSIS — Z4682 Encounter for fitting and adjustment of non-vascular catheter: Secondary | ICD-10-CM | POA: Diagnosis not present

## 2021-04-30 DIAGNOSIS — I700 Atherosclerosis of aorta: Secondary | ICD-10-CM | POA: Diagnosis not present

## 2021-05-07 DEATH — deceased

## 2021-06-04 ENCOUNTER — Ambulatory Visit: Payer: Medicare Other | Admitting: Gastroenterology
